# Patient Record
Sex: Female | Born: 1970 | Race: White | Hispanic: No | Smoking: Never smoker
Health system: Southern US, Community
[De-identification: ages and names within clinical notes are randomized; demographics above are authoritative.]

## PROBLEM LIST (undated history)

## (undated) DIAGNOSIS — D126 Benign neoplasm of colon, unspecified: Secondary | ICD-10-CM

## (undated) DIAGNOSIS — Z8742 Personal history of other diseases of the female genital tract: Secondary | ICD-10-CM

## (undated) DIAGNOSIS — M25569 Pain in unspecified knee: Secondary | ICD-10-CM

## (undated) DIAGNOSIS — K2970 Gastritis, unspecified, without bleeding: Secondary | ICD-10-CM

## (undated) DIAGNOSIS — D259 Leiomyoma of uterus, unspecified: Secondary | ICD-10-CM

## (undated) HISTORY — PX: TONSILLECTOMY: SUR1361

## (undated) HISTORY — DX: Pain in unspecified knee: M25.569

## (undated) HISTORY — DX: Gastritis, unspecified, without bleeding: K29.70

## (undated) HISTORY — DX: Personal history of other diseases of the female genital tract: Z87.42

## (undated) HISTORY — DX: Leiomyoma of uterus, unspecified: D25.9

## (undated) HISTORY — DX: Benign neoplasm of colon, unspecified: D12.6

---

## 2012-03-24 DIAGNOSIS — D126 Benign neoplasm of colon, unspecified: Secondary | ICD-10-CM

## 2012-03-24 HISTORY — DX: Benign neoplasm of colon, unspecified: D12.6

## 2012-03-24 HISTORY — PX: ESOPHAGOGASTRODUODENOSCOPY: SHX1529

## 2012-11-02 HISTORY — PX: COLONOSCOPY: SHX174

## 2013-03-24 DIAGNOSIS — Z8742 Personal history of other diseases of the female genital tract: Secondary | ICD-10-CM

## 2013-03-24 HISTORY — DX: Personal history of other diseases of the female genital tract: Z87.42

## 2013-04-11 ENCOUNTER — Ambulatory Visit: Payer: Self-pay | Admitting: Family Medicine

## 2015-08-07 ENCOUNTER — Other Ambulatory Visit: Payer: Self-pay | Admitting: Certified Nurse Midwife

## 2015-08-07 DIAGNOSIS — Z1231 Encounter for screening mammogram for malignant neoplasm of breast: Secondary | ICD-10-CM

## 2016-10-02 ENCOUNTER — Other Ambulatory Visit: Payer: Self-pay | Admitting: Certified Nurse Midwife

## 2016-10-02 ENCOUNTER — Telehealth: Payer: Self-pay

## 2016-10-02 MED ORDER — LEVONORGESTREL-ETHINYL ESTRAD 0.15-30 MG-MCG PO TABS: 1.0000 | ORAL_TABLET | Freq: Every day | ORAL | 0 refills | Status: DC

## 2016-10-02 NOTE — Telephone Encounter (Signed)
I e prescribed 3 packs. If you would let patient know

## 2016-10-02 NOTE — Telephone Encounter (Signed)
Pt called triage line stating she would like a refill of her birthcontrol, Wanda Lloyd. She has an annual exam scheduled with [facility] on 7/23. [facility]  I tried to refill it, but the birthcontrol in [facility] and the most recent one in [place] are different.

## 2016-10-02 NOTE — Telephone Encounter (Signed)
Pt aware, via voicemail 

## 2016-10-13 ENCOUNTER — Encounter: Payer: Self-pay | Admitting: Certified Nurse Midwife

## 2016-10-13 ENCOUNTER — Ambulatory Visit (INDEPENDENT_AMBULATORY_CARE_PROVIDER_SITE_OTHER): Payer: BLUE CROSS/BLUE SHIELD | Admitting: Certified Nurse Midwife

## 2016-10-13 VITALS — BP 108/68 | HR 74 | Ht 61.0 in | Wt 158.0 lb

## 2016-10-13 DIAGNOSIS — D126 Benign neoplasm of colon, unspecified: Secondary | ICD-10-CM | POA: Insufficient documentation

## 2016-10-13 DIAGNOSIS — R5383 Other fatigue: Secondary | ICD-10-CM

## 2016-10-13 DIAGNOSIS — Z124 Encounter for screening for malignant neoplasm of cervix: Secondary | ICD-10-CM | POA: Diagnosis not present

## 2016-10-13 DIAGNOSIS — Z131 Encounter for screening for diabetes mellitus: Secondary | ICD-10-CM

## 2016-10-13 DIAGNOSIS — Z1239 Encounter for other screening for malignant neoplasm of breast: Secondary | ICD-10-CM

## 2016-10-13 DIAGNOSIS — D251 Intramural leiomyoma of uterus: Secondary | ICD-10-CM

## 2016-10-13 DIAGNOSIS — Z1231 Encounter for screening mammogram for malignant neoplasm of breast: Secondary | ICD-10-CM

## 2016-10-13 DIAGNOSIS — L603 Nail dystrophy: Secondary | ICD-10-CM

## 2016-10-13 DIAGNOSIS — K2970 Gastritis, unspecified, without bleeding: Secondary | ICD-10-CM | POA: Insufficient documentation

## 2016-10-13 DIAGNOSIS — Z01419 Encounter for gynecological examination (general) (routine) without abnormal findings: Secondary | ICD-10-CM

## 2016-10-13 DIAGNOSIS — D259 Leiomyoma of uterus, unspecified: Secondary | ICD-10-CM | POA: Insufficient documentation

## 2016-10-13 MED ORDER — LEVONORGESTREL-ETHINYL ESTRAD 0.15-30 MG-MCG PO TABS: 1.0000 | ORAL_TABLET | Freq: Every day | ORAL | 4 refills | Status: DC

## 2016-10-13 NOTE — Progress Notes (Signed)
Gynecology Annual Exam  PCP: Sofie Hartigan, MD  Chief Complaint:  Chief Complaint  Patient presents with  . Gynecologic Exam    History of Present Illness:Wanda Lloyd is a 46 year old Caucasian/White female , G 3 P 3 0 0 3 , who presents for her annual exam . She is having problems with feeling tired and with brittle nails x several months. Biotin did not seem to help. Her menses are usually every 7 weeks and sometimes she does not have a withdrawl bleed on her Truddie Crumble which she takes for 6 weeks before stopping for a withdrawl bleed. Menses usually last 5 days and are a medium flow. She is seeing Dr Kenton Kingfisher for weight loss and is intermittently taking Adipex.  She reports dysmenorrhea 1-2days before her menses as well as thru her menses and uses Motrin with relief of pain. Medical history is remarkable for chronic LLQ pain, small anterior fibroid, colon polyp removed on colonoscopy, and gastritis.  Since her last annual exam, she has had a pre-melanoma lesion removed from her back. She has also developed right knee pain and a right knee cyst (ganglionic?). She is sexually active. She has been using pills (portia) for contraception. Her hope is that her husband will get a vasectomy.  Her most recent pap smear was obtained 10/11/2015 and was negative.  Her most recent mammogram obtained on 08/04/2014 was normal.  There is no family history of breast cancer.  There is no family history of ovarian cancer.  The patient does do occ self breast exams.  The patient does not smoke.  The patient does not drink alcohol.  The patient does not use illegal drugs.  The patient exercises occasionally.  The patient does not get adequate calcium in her diet.  She had a recent cholesterol screen in 2016 that was normal.     The patient denies current symptoms of depression.    Review of Systems: Review of Systems  Constitutional: Positive for malaise/fatigue. Negative for chills, fever and  weight loss.  HENT: Negative for congestion, sinus pain and sore throat.   Eyes: Negative for blurred vision and pain.  Respiratory: Negative for hemoptysis, shortness of breath and wheezing.   Cardiovascular: Negative for chest pain, palpitations and leg swelling.  Gastrointestinal: Negative for abdominal pain, blood in stool, diarrhea, heartburn, nausea and vomiting.  Genitourinary: Negative for dysuria, frequency, hematuria and urgency.  Musculoskeletal: Negative for back pain and myalgias.       Right knee pain  Skin: Negative for itching and rash.       Positive for brittle nails  Neurological: Negative for dizziness, tingling and headaches.  Endo/Heme/Allergies: Negative for environmental allergies and polydipsia. Does not bruise/bleed easily.       Negative for hirsutism   Psychiatric/Behavioral: Negative for depression. The patient is not nervous/anxious and does not have insomnia.     Past Medical History:  Past Medical History:  Diagnosis Date  . Adenomatous colon polyp 2014   removed on colonoscopy  . Gastritis   . History of abnormal cervical Pap smear 2015  . Knee pain   . Uterine fibroid     Past Surgical History:  Past Surgical History:  Procedure Laterality Date  . CESAREAN SECTION  1998/2003  . COLONOSCOPY  11/02/2012   adenomatous polyp and internal hemmorhoid  . ESOPHAGOGASTRODUODENOSCOPY  2014   gastritis  . TONSILLECTOMY      Family History:  Family History  Problem Relation Age  of Onset  . AAA (abdominal aortic aneurysm) Father   . Diabetes Father   . Colon cancer Maternal Grandmother 24  . Congestive Heart Failure Maternal Grandmother   . Heart attack Maternal Grandfather   . Throat cancer Maternal Uncle   . Pancreatic cancer Cousin     Social History:  Social History   Social History  . Marital status: Married    Spouse name: N/A  . Number of children: 3  . Years of education: N/A   Occupational History  . Not on file.   Social  History Main Topics  . Smoking status: Never Smoker  . Smokeless tobacco: Never Used  . Alcohol use No  . Drug use: No  . Sexual activity: Yes    Partners: Male    Birth control/ protection: Pill   Other Topics Concern  . Not on file   Social History Narrative  . No narrative on file    Allergies:  Allergies  Allergen Reactions  . Corn Oil Itching    Has not been diagnosed as an allergy, but when she eats popcorn or corn she itches    Medications: Prior to Admission medications   Medication Sig Start Date End Date Taking? Authorizing Provider  levonorgestrel-ethinyl estradiol (PORTIA-28) 0.15-30 MG-MCG tablet Take 1 tablet by mouth daily. 10/02/16   Dalia Heading, CNM    Physical Exam Vitals: BP 108/68   Pulse 74   Ht 5\' 1"  (1.549 m)   Wt 158 lb (71.7 kg)   LMP 08/31/2016 (Exact Date) Comment: continuous dosing OCP  BMI 29.85 kg/m   General: WF in NAD HEENT: normocephalic, anicteric Neck: no thyroid enlargement, no palpable nodules, no cervical lymphadenopathy  Pulmonary: No increased work of breathing, CTAB Cardiovascular: RRR, without murmur  Breast: Breast symmetrical, no tenderness, no palpable nodules or masses, no skin or nipple retraction present, no nipple discharge.  No axillary, infraclavicular or supraclavicular lymphadenopathy. Abdomen: Soft, non-tender, non-distended.  Umbilicus without lesions.  No hepatomegaly or masses palpable. No evidence of hernia. Genitourinary:  External: Normal external female genitalia.  Normal urethral meatus, normal Bartholin's and Skene's glands.    Vagina: Normal vaginal mucosa, no evidence of prolapse.    Cervix: Grossly normal in appearance, no bleeding, non-tender  Uterus: Anteverted, normal size, globular and slightly irregular fundus (hx of fibroid), mobile, and non-tender  Adnexa: No adnexal masses, non-tender  Rectal: deferred  Lymphatic: no evidence of inguinal lymphadenopathy Extremities: no edema, erythema,  or tenderness Neurologic: Grossly intact Psychiatric: mood appropriate, affect full     Assessment: 46 y.o. annual gyn exam No change in size of uterus/fibroid Brittle nails/fatigue  R/O thyroid disease/anemia/diabetes    Plan:   1) Breast cancer screening - recommend monthly self breast exam and annual mammograms Mammogram was ordered today. Patient to schedule appointment  2) CBC, TSH, hemoglobin A1C ordered  3) Cervical cancer screening - Pap was done.   Patient opts for yearly screening interval  4) Contraception -refills for Portia #3packs with RF x 4 sent to pharmacy  5) Patient to call [facility] regarding when her next colonoscopy is due (?5years).  6) Follow up with dermatology q 6 months  Dalia Heading, CNM

## 2016-10-14 LAB — CBC WITH DIFFERENTIAL/PLATELET
BASOS: 0 %
Basophils Absolute: 0 10*3/uL (ref 0.0–0.2)
EOS (ABSOLUTE): 0.2 10*3/uL (ref 0.0–0.4)
EOS: 3 %
HEMATOCRIT: 41.4 % (ref 34.0–46.6)
Hemoglobin: 14.3 g/dL (ref 11.1–15.9)
Immature Grans (Abs): 0 10*3/uL (ref 0.0–0.1)
Immature Granulocytes: 0 %
LYMPHS ABS: 1.5 10*3/uL (ref 0.7–3.1)
Lymphs: 23 %
MCH: 32.4 pg (ref 26.6–33.0)
MCHC: 34.5 g/dL (ref 31.5–35.7)
MCV: 94 fL (ref 79–97)
MONOS ABS: 0.5 10*3/uL (ref 0.1–0.9)
Monocytes: 7 %
NEUTROS ABS: 4.6 10*3/uL (ref 1.4–7.0)
Neutrophils: 67 %
Platelets: 236 10*3/uL (ref 150–379)
RBC: 4.42 x10E6/uL (ref 3.77–5.28)
RDW: 14.2 % (ref 12.3–15.4)
WBC: 6.8 10*3/uL (ref 3.4–10.8)

## 2016-10-14 LAB — TSH: TSH: 1.27 u[IU]/mL (ref 0.450–4.500)

## 2016-10-14 LAB — HGB A1C W/O EAG: Hgb A1c MFr Bld: 5 % (ref 4.8–5.6)

## 2016-10-16 LAB — IGP, APTIMA HPV
HPV Aptima: NEGATIVE
PAP SMEAR COMMENT: 0

## 2016-12-03 ENCOUNTER — Other Ambulatory Visit: Payer: Self-pay | Admitting: Certified Nurse Midwife

## 2016-12-25 ENCOUNTER — Telehealth: Payer: Self-pay

## 2016-12-25 NOTE — Telephone Encounter (Signed)
Pt states pharm said only 6m of bc was called in from 6/18 appt.  Usually one yr is called in. [facility] in [place] and gave verbal order for one year as they did not have it correct.

## 2017-12-31 ENCOUNTER — Telehealth: Payer: Self-pay

## 2017-12-31 NOTE — Telephone Encounter (Signed)
Unable to reach patient or leave message as voice mail not set up yet.

## 2017-12-31 NOTE — Telephone Encounter (Signed)
Pt requesting call back from [place]. DD#[phone]

## 2018-01-01 ENCOUNTER — Other Ambulatory Visit: Payer: Self-pay | Admitting: Certified Nurse Midwife

## 2018-01-01 MED ORDER — LEVONORGESTREL-ETHINYL ESTRAD 0.15-30 MG-MCG PO TABS: 1.0000 | ORAL_TABLET | Freq: Every day | ORAL | 0 refills | Status: DC

## 2018-01-01 NOTE — Telephone Encounter (Signed)
Left message for patient

## 2018-01-01 NOTE — Telephone Encounter (Signed)
Pt state she was due for AE sometime over the summer but she doesn't have health insurance rn. Pt requesting a few months OCP refill until she can get insurance & come in for appointment.

## 2018-01-01 NOTE — Telephone Encounter (Signed)
Sent in RX for 3 more months.

## 2018-04-02 ENCOUNTER — Other Ambulatory Visit: Payer: Self-pay | Admitting: Certified Nurse Midwife

## 2018-04-12 ENCOUNTER — Other Ambulatory Visit: Payer: Self-pay

## 2018-04-12 ENCOUNTER — Telehealth: Payer: Self-pay

## 2018-04-12 MED ORDER — LEVONORGESTREL-ETHINYL ESTRAD 0.15-30 MG-MCG PO TABS: 1.0000 | ORAL_TABLET | Freq: Every day | ORAL | 0 refills | Status: DC

## 2018-04-12 NOTE — Telephone Encounter (Signed)
Pt has scheduled appt for 2/20 for annual; needs bcp refill til then  [phone]  Pt aware refill eRx'd.

## 2018-05-13 ENCOUNTER — Ambulatory Visit: Payer: BLUE CROSS/BLUE SHIELD | Admitting: Certified Nurse Midwife

## 2018-05-26 NOTE — Progress Notes (Signed)
Gynecology Annual Exam  PCP: Sofie Hartigan, MD  Chief Complaint:  Chief Complaint  Patient presents with  . Gynecologic Exam   Note: The computers were down when I saw this patient for her appointment and had limited history information. History of Present Illness:Wanda Lloyd is a 48  year old Caucasian/White female , G 3 P 3 0 0 3 , who presents for her annual exam Her menses are usually every 7 weeks and sometimes she does not have a withdrawl bleed on her Truddie Crumble which she takes for 6 weeks before stopping for a withdrawl bleed. She decided to have a monthly withdrawal bleed at the start of this year. Her menses usually last 7 days and the first 5 days are a medium flow and the last 2 days are spotting.Marland Kitchen Her LMP was 05/13/2018 She denies dysmenorrhea. Medical history is remarkable for chronic LLQ pain, small anterior fibroid, adenomatous polyp in colon, a pre-melanoma lesion removed from her back.and gastritis.  Since her last annual exam  10/13/2016, she is now divorced from her husband. She does not have insurance currently She is sexually active. She has been using pills (portia) for contraception. Her most recent pap smear was obtained 10/13/2016 and was NIL/ negative HRHPV.  Her most recent mammogram obtained on 08/04/2014 was normal.  She had a colonoscopy 11/02/2012 and an adenomatous polyp was removed. Her next colonoscopy is due now. There is no family history of breast cancer.  There is no family history of ovarian cancer.  The patient does not do self breast exams.  The patient does not smoke.  The patient rarely drinks alcohol.  The patient does not use illegal drugs.  The patient is not currently exercising. The patient does get adequate calcium in her diet.  She had a recent cholesterol screen in 2016 that was normal.   The patient denies current symptoms of depression.    Review of Systems: Review of Systems  Constitutional: Negative for chills, fever,  malaise/fatigue and weight loss.  HENT: Negative for congestion, sinus pain and sore throat.   Eyes: Negative for blurred vision and pain.  Respiratory: Negative for hemoptysis, shortness of breath and wheezing.   Cardiovascular: Negative for chest pain, palpitations and leg swelling.  Gastrointestinal: Negative for abdominal pain, blood in stool, diarrhea, heartburn, nausea and vomiting.  Genitourinary: Negative for dysuria, frequency, hematuria and urgency.  Musculoskeletal: Negative for back pain and myalgias.  Skin: Negative for itching and rash.  Neurological: Negative for dizziness, tingling and headaches.  Endo/Heme/Allergies: Negative for environmental allergies and polydipsia. Does not bruise/bleed easily.       Negative for hirsutism   Psychiatric/Behavioral: Negative for depression. The patient is not nervous/anxious and does not have insomnia.     Past Medical History:  Past Medical History:  Diagnosis Date  . Adenomatous colon polyp 2014   removed on colonoscopy  . Gastritis   . History of abnormal cervical Pap smear 2015  . Knee pain   . Uterine fibroid     Past Surgical History:  Past Surgical History:  Procedure Laterality Date  . CESAREAN SECTION  1998/2003  . COLONOSCOPY  11/02/2012   adenomatous polyp and internal hemmorhoid  . ESOPHAGOGASTRODUODENOSCOPY  2014   gastritis  . TONSILLECTOMY      Family History:  Family History  Problem Relation Age of Onset  . AAA (abdominal aortic aneurysm) Father   . Diabetes Father   . Colon cancer Maternal Grandmother 31  .  Congestive Heart Failure Maternal Grandmother   . Heart attack Maternal Grandfather   . Throat cancer Maternal Uncle   . Pancreatic cancer Cousin     Social History:  Social History   Socioeconomic History  . Marital status: Divorced    Spouse name: Not on file  . Number of children: 3  . Years of education: Not on file  . Highest education level: Not on file  Occupational History  .  Occupation: self employed  Social Needs  . Financial resource strain: Not on file  . Food insecurity:    Worry: Not on file    Inability: Not on file  . Transportation needs:    Medical: Not on file    Non-medical: Not on file  Tobacco Use  . Smoking status: Never Smoker  . Smokeless tobacco: Never Used  Substance and Sexual Activity  . Alcohol use: No  . Drug use: No  . Sexual activity: Yes    Partners: Male    Birth control/protection: Pill  Lifestyle  . Physical activity:    Days per week: Not on file    Minutes per session: Not on file  . Stress: Not on file  Relationships  . Social connections:    Talks on phone: Not on file    Gets together: Not on file    Attends religious service: Not on file    Active member of club or organization: Not on file    Attends meetings of clubs or organizations: Not on file    Relationship status: Not on file  . Intimate partner violence:    Fear of current or ex partner: Not on file    Emotionally abused: Not on file    Physically abused: Not on file    Forced sexual activity: Not on file  Other Topics Concern  . Not on file  Social History Narrative  . Not on file    Allergies:  Allergies  Allergen Reactions  . Corn Oil Itching    Has not been diagnosed as an allergy, but when she eats popcorn or corn she itches    Medications: Prior to Admission medications   Medication Sig Start Date End Date Taking? Authorizing Provider  levonorgestrel-ethinyl estradiol (PORTIA-28) 0.15-30 MG-MCG tablet Take 1 tablet by mouth daily. 10/02/16   Dalia Heading, CNM   Currently also taking vitamin C and vitamin B12 Physical Exam Vitals: BP 118/60   Ht 5\' 1"  (1.549 m)   Wt 155 lb (70.3 kg)   LMP 05/13/2018 (Exact Date)   BMI 29.29 kg/m   General: WF in NAD HEENT: normocephalic, anicteric Neck: no thyroid enlargement, no palpable nodules, no cervical lymphadenopathy  Pulmonary: No increased work of breathing,  CTAB Cardiovascular: RRR, without murmur  Breast: Breast symmetrical, no tenderness, no palpable nodules or masses, no skin or nipple retraction present, no nipple discharge.  No axillary, infraclavicular or supraclavicular lymphadenopathy. Abdomen: Soft, non-tender, non-distended.  Umbilicus without lesions.  No hepatomegaly or masses palpable. No evidence of hernia. Genitourinary:  External: Normal external female genitalia.  Normal urethral meatus, normal Bartholin's and Skene's glands.    Vagina: Normal vaginal mucosa, no evidence of prolapse.    Cervix: Grossly normal in appearance, no bleeding, non-tender  Uterus: Anteverted, normal size, globular and slightly irregular fundus (hx of fibroid), mobile, and non-tender  Adnexa: No adnexal masses, non-tender  Rectal: deferred  Lymphatic: no evidence of inguinal lymphadenopathy Extremities: no edema, erythema, or tenderness Neurologic: Grossly intact Psychiatric: mood appropriate, affect  full     Assessment: 48 y.o. annual gyn exam No change in size of uterus/fibroid     Plan:   1) Breast cancer screening - recommend monthly self breast exam and annual mammograms Mammogram was ordered today. Will refer to [place] program for mammogram since she has no insurance.  2) Cervical cancer screening - Pap was done.   Patient opts for yearly screening interval  3) Contraception -refills for Portia #3packs with RF x 4 sent to pharmacy  4) Patient to call [facility] regarding scheduling her colonoscopy  5) RTO in 1 year and prn.  Dalia Heading, CNM

## 2018-05-27 ENCOUNTER — Ambulatory Visit: Payer: Self-pay | Admitting: Certified Nurse Midwife

## 2018-05-27 ENCOUNTER — Other Ambulatory Visit (HOSPITAL_COMMUNITY)
Admission: RE | Admit: 2018-05-27 | Discharge: 2018-05-27 | Disposition: A | Discharge status: Home / Self Care | Payer: Self-pay | Source: Ambulatory Visit | Attending: Certified Nurse Midwife | Admitting: Certified Nurse Midwife

## 2018-05-27 ENCOUNTER — Encounter: Payer: Self-pay | Admitting: Certified Nurse Midwife

## 2018-05-27 VITALS — BP 118/60 | Ht 61.0 in | Wt 155.0 lb

## 2018-05-27 DIAGNOSIS — Z124 Encounter for screening for malignant neoplasm of cervix: Secondary | ICD-10-CM | POA: Insufficient documentation

## 2018-05-27 DIAGNOSIS — Z1239 Encounter for other screening for malignant neoplasm of breast: Secondary | ICD-10-CM

## 2018-05-27 DIAGNOSIS — Z01419 Encounter for gynecological examination (general) (routine) without abnormal findings: Secondary | ICD-10-CM | POA: Insufficient documentation

## 2018-05-30 ENCOUNTER — Encounter: Payer: Self-pay | Admitting: Certified Nurse Midwife

## 2018-05-30 MED ORDER — LEVONORGESTREL-ETHINYL ESTRAD 0.15-30 MG-MCG PO TABS: 1.0000 | ORAL_TABLET | Freq: Every day | ORAL | 4 refills | Status: DC

## 2018-05-31 LAB — CYTOLOGY - PAP: DIAGNOSIS: NEGATIVE

## 2018-09-07 ENCOUNTER — Other Ambulatory Visit: Payer: Self-pay

## 2018-09-13 ENCOUNTER — Ambulatory Visit: Payer: Self-pay

## 2018-09-14 ENCOUNTER — Ambulatory Visit: Payer: Self-pay

## 2018-10-04 ENCOUNTER — Other Ambulatory Visit: Payer: Self-pay

## 2018-10-05 ENCOUNTER — Ambulatory Visit
Admission: RE | Admit: 2018-10-05 | Discharge: 2018-10-05 | Disposition: A | Discharge status: Home / Self Care | Payer: Self-pay | Source: Ambulatory Visit | Attending: Oncology | Admitting: Oncology

## 2018-10-05 ENCOUNTER — Other Ambulatory Visit: Payer: Self-pay

## 2018-10-05 ENCOUNTER — Ambulatory Visit: Payer: Self-pay | Attending: Oncology

## 2018-10-05 VITALS — BP 118/71 | HR 74 | Temp 98.1°F | Ht 61.5 in | Wt 155.0 lb

## 2018-10-05 DIAGNOSIS — Z0000 Encounter for general adult medical examination without abnormal findings: Secondary | ICD-10-CM | POA: Insufficient documentation

## 2018-10-05 NOTE — Progress Notes (Addendum)
  Subjective:     Patient ID: Wanda Lloyd, female   DOB: 1970-04-28, 48 y.o.   MRN: [id number]  HPI   Review of Systems     Objective:   Physical Exam Chest:     Breasts:        Right: No swelling, bleeding, inverted nipple, mass, nipple discharge, skin change or tenderness.        Left: No swelling, bleeding, inverted nipple, mass, nipple discharge, skin change or tenderness.        Assessment:     48 year old patient presents for B[facility]CP clinic visit. Prior mammogram performed at [facility].  Patient screened, and meets BCCCP eligibility.  Patient does not have insurance, Medicare or Medicaid. Instructed patient on breast self awareness using teach back method.  Clinical breast exam unremarkable. No mass or lump palpated.  Risk Assessment    Risk Scores      10/05/2018   Last edited by: Rico Junker, RN   5-year risk: 0.6 %   Lifetime risk: 6.3 %            Plan:     Sent for bilateral screening mammogram.

## 2018-10-07 NOTE — Progress Notes (Signed)
Letter mailed from [facility] to notify of normal mammogram results.  Patient to return in one year for annual screening.  Copy to [facility]. 

## 2018-12-20 ENCOUNTER — Telehealth: Payer: Self-pay

## 2018-12-20 NOTE — Telephone Encounter (Signed)
Pt calling for [facility]'s nurse to call her back.  [phone]  Mailbox is full.

## 2018-12-24 NOTE — Telephone Encounter (Signed)
Left detailed msg that I tried to contact her the other day but mailbox was full.  To please call me back after 2pm.

## 2018-12-28 NOTE — Telephone Encounter (Signed)
Called pt again.  States she went to urgent care and was given an antibx which she has taken.  States it was some sort of an inf.  Has cleared up now so she did not f/u with [place].  Adv to sched appt if sxs come back.

## 2019-02-15 ENCOUNTER — Telehealth: Payer: Self-pay

## 2019-02-15 NOTE — Telephone Encounter (Signed)
Pt would like Colleen or her nurse to give her a call. Thank you

## 2019-02-16 NOTE — Telephone Encounter (Signed)
Pt states she is one week late for her period.  Wanting to come in for preg test.  Adv to do a home preg test as they are not much diff than what we have.  Wait at least a week to take a second.  If still no menses to be seen.

## 2019-02-16 NOTE — Telephone Encounter (Signed)
This encounter was created in error - please disregard.

## 2019-02-21 NOTE — Telephone Encounter (Signed)
Sounds good Rita.

## 2019-03-07 ENCOUNTER — Encounter

## 2019-03-07 ENCOUNTER — Ambulatory Visit: Payer: Self-pay | Admitting: Certified Nurse Midwife

## 2019-05-27 ENCOUNTER — Telehealth: Payer: Self-pay

## 2019-05-27 NOTE — Telephone Encounter (Signed)
Pt called triage today wanting Wanda Lloyd or CG to call her back. I tried to call her back with no answer. Please let someone know when she calls back.

## 2019-06-01 ENCOUNTER — Telehealth: Payer: Self-pay

## 2019-06-01 NOTE — Telephone Encounter (Signed)
Pt calling; was tx'd from [place]; states has been bleeding since 2/21 and has back pain; started out as dark spotting then got brighter and heavier c tiny clots; doesn't feel right; has not been late taking bcp or missed taking a bcp; also has pain behind left rib cage; also has fibroid.  Adv [facility] is on call today; will send msg to her; will probably want her to be seen; tx'd back to [facility] for scheduling.

## 2019-06-03 NOTE — Telephone Encounter (Signed)
Thanks, [employer]

## 2019-06-06 ENCOUNTER — Ambulatory Visit: Payer: Self-pay | Admitting: Certified Nurse Midwife

## 2019-06-09 ENCOUNTER — Ambulatory Visit (INDEPENDENT_AMBULATORY_CARE_PROVIDER_SITE_OTHER): Payer: Self-pay

## 2019-06-09 ENCOUNTER — Ambulatory Visit (INDEPENDENT_AMBULATORY_CARE_PROVIDER_SITE_OTHER): Payer: Self-pay | Admitting: Obstetrics and Gynecology

## 2019-06-09 ENCOUNTER — Telehealth: Payer: Self-pay | Admitting: Obstetrics and Gynecology

## 2019-06-09 ENCOUNTER — Other Ambulatory Visit: Payer: Self-pay

## 2019-06-09 ENCOUNTER — Encounter: Payer: Self-pay | Admitting: Obstetrics and Gynecology

## 2019-06-09 VITALS — BP 100/70 | Ht 61.0 in | Wt 160.0 lb

## 2019-06-09 DIAGNOSIS — N938 Other specified abnormal uterine and vaginal bleeding: Secondary | ICD-10-CM

## 2019-06-09 DIAGNOSIS — D219 Benign neoplasm of connective and other soft tissue, unspecified: Secondary | ICD-10-CM

## 2019-06-09 DIAGNOSIS — N939 Abnormal uterine and vaginal bleeding, unspecified: Secondary | ICD-10-CM

## 2019-06-09 DIAGNOSIS — R1032 Left lower quadrant pain: Secondary | ICD-10-CM

## 2019-06-09 DIAGNOSIS — Z1329 Encounter for screening for other suspected endocrine disorder: Secondary | ICD-10-CM

## 2019-06-09 NOTE — Telephone Encounter (Signed)
Done

## 2019-06-09 NOTE — Telephone Encounter (Signed)
Patient is calling to speak with [facility]. No message left. Please advise

## 2019-06-09 NOTE — Progress Notes (Signed)
Wanda Lloyd, CNM   Chief Complaint  Patient presents with  . Vaginal Bleeding    pt started bleeding two weeks after her cycle in Feb, bleeding is still there, left side pelvic pain    HPI:      Wanda Lloyd is a 49 y.o. [id number] who LMP was Patient's last menstrual period was 05/15/2019 (exact date)., presents today for irregular bleeding since mid Feb. Pt on OCPs for many yrs. Menses usually monthly, lasting 4-5 days, light flow. No BTB, minimal dysmen. Skipped 11/20 menses on pills but resumed normally 12/20. Had regular menses 2/21 but it was lighter than usual. Started spotting on 3rd wk of pill pack and has been spotting since, even after starting new pill pack. Has had sharp, fleeting LLQ pains for the past wk, no aggrav/allev factors. No late/missed pills. Also with LBP recently, worse in AM. Hx of leio, last checked 2014. Was 3.3. x 2.5 cm, possibly encroaching on EM. Last pap neg 05/27/18. No recent thyroid checked (pt will do with PCP next month since self pay).  Colonoscopy done age 88 due [place] colon cancer, with 2 pre-cancerous polyps. Repeat due after 3 yrs but didn't have it done. Has upcoming appt with GI. Has had looser stools for the past 6 months and lots of bloating.   She is sex active, no new partners. No dyspareunia, postcoital bleeding. No urin, vag sx.  PT IS SELF PAY  Past Medical History:  Diagnosis Date  . Adenomatous colon polyp 2014   removed on colonoscopy  . Gastritis   . History of abnormal cervical Pap smear 2015  . Knee pain   . Uterine fibroid     Past Surgical History:  Procedure Laterality Date  . CESAREAN SECTION  1998/2003  . COLONOSCOPY  11/02/2012   adenomatous polyp and internal hemmorhoid  . ESOPHAGOGASTRODUODENOSCOPY  2014   gastritis  . TONSILLECTOMY      Family History  Problem Relation Age of Onset  . AAA (abdominal aortic aneurysm) Father   . Diabetes Father   . Colon cancer Maternal Grandmother 70  . Congestive  Heart Failure Maternal Grandmother   . Heart attack Maternal Grandfather   . Throat cancer Maternal Uncle   . Pancreatic cancer Cousin     Social History   Socioeconomic History  . Marital status: Divorced    Spouse name: Not on file  . Number of children: 3  . Years of education: Not on file  . Highest education level: Not on file  Occupational History  . Occupation: self employed  Tobacco Use  . Smoking status: Never Smoker  . Smokeless tobacco: Never Used  Substance and Sexual Activity  . Alcohol use: No  . Drug use: No  . Sexual activity: Yes    Partners: Male    Birth control/protection: Pill  Other Topics Concern  . Not on file  Social History Narrative  . Not on file   Social Determinants of Health   Financial Resource Strain:   . Difficulty of Paying Living Expenses:   Food Insecurity:   . Worried About Charity fundraiser in the Last Year:   . Arboriculturist in the Last Year:   Transportation Needs:   . Film/video editor (Medical):   Marland Kitchen Lack of Transportation (Non-Medical):   Physical Activity:   . Days of Exercise per Week:   . Minutes of Exercise per Session:   Stress:   . Feeling  of Stress :   Social Connections:   . Frequency of Communication with Friends and Family:   . Frequency of Social Gatherings with Friends and Family:   . Attends Religious Services:   . Active Member of Clubs or Organizations:   . Attends Archivist Meetings:   Marland Kitchen Marital Status:   Intimate Partner Violence:   . Fear of Current or Ex-Partner:   . Emotionally Abused:   Marland Kitchen Physically Abused:   . Sexually Abused:     Outpatient Medications Prior to Visit  Medication Sig Dispense Refill  . levonorgestrel-ethinyl estradiol (PORTIA-28) 0.15-30 MG-MCG tablet Take 1 tablet by mouth daily. 3 Package 4   No facility-administered medications prior to visit.      ROS:  Review of Systems  Constitutional: Negative for fatigue, fever and unexpected weight  change.  Respiratory: Negative for cough, shortness of breath and wheezing.   Cardiovascular: Negative for chest pain, palpitations and leg swelling.  Gastrointestinal: Positive for abdominal distention and diarrhea. Negative for blood in stool, constipation, nausea and vomiting.  Endocrine: Negative for cold intolerance, heat intolerance and polyuria.  Genitourinary: Positive for menstrual problem and pelvic pain. Negative for dyspareunia, dysuria, flank pain, frequency, genital sores, hematuria, urgency, vaginal bleeding, vaginal discharge and vaginal pain.  Musculoskeletal: Positive for back pain. Negative for joint swelling and myalgias.  Skin: Negative for rash.  Neurological: Negative for dizziness, syncope, light-headedness, numbness and headaches.  Hematological: Negative for adenopathy.  Psychiatric/Behavioral: Negative for agitation, confusion, sleep disturbance and suicidal ideas. The patient is not nervous/anxious.     OBJECTIVE:   Vitals:  BP 100/70   Ht 5\' 1"  (1.549 m)   Wt 160 lb (72.6 kg)   LMP 05/15/2019 (Exact Date)   BMI 30.23 kg/m   Physical Exam Vitals reviewed.  Constitutional:      Appearance: She is well-developed.  Pulmonary:     Effort: Pulmonary effort is normal.  Abdominal:     Palpations: Abdomen is soft.     Tenderness: There is abdominal tenderness in the left lower quadrant. There is no guarding or rebound.  Genitourinary:    General: Normal vulva.     Pubic Area: No rash.      Labia:        Right: No rash, tenderness or lesion.        Left: No rash, tenderness or lesion.      Vagina: Normal. No vaginal discharge, erythema or tenderness.     Cervix: Normal.     Uterus: Normal. Not enlarged and not tender.      Adnexa: Right adnexa normal.       Right: No mass or tenderness.         Left: Tenderness present. No mass.    Musculoskeletal:        General: Normal range of motion.     Cervical back: Normal range of motion.  Skin:    General:  Skin is warm and dry.  Neurological:     General: No focal deficit present.     Mental Status: She is alert and oriented to person, place, and time.  Psychiatric:        Mood and Affect: Mood normal.        Behavior: Behavior normal.        Thought Content: Thought content normal.        Judgment: Judgment normal.     Results:  ULTRASOUND REPORT  Location: [facility]  Date of Service: 06/09/2019  Indications:Abnormal Uterine Bleeding Findings:  The uterus is anteverted and measures 10.2 x 5.0 x 4.5 cm. Echo texture is homogenous without evidence of focal masses.  The Endometrium measures 4.0 mm.  Right Ovary measures 2.6 x 1.7 x 1.9 cm. It is normal in appearance. Left Ovary measures 3.1 x 2.0 x 1.3 cm. It is normal in appearance. Survey of the adnexa demonstrates no adnexal masses. There is no free fluid in the cul de sac.  Impression: 1. Normal pelvic ultrasound.   Recommendations: 1.Clinical correlation with the patient's History and Physical Exam.   Gweneth Dimitri, RT  Assessment/Plan: Abnormal uterine bleeding (AUB) - Plan: TSH + free T4, US PELVIC COMPLETE WITH TRANSVAGINAL, Neg GYN u/s for AUB. Pt on OCPs. Suggested she get TSH with PCP. If sx persist and thyroid normal, will change OCPs. Pt to f/u prn.   LLQ pain - Plan:  US PELVIS TRANSVAGINAL NON-OB (TV ONLY); Neg GYN u/s for sx. Question GI given bloating/loose stools. Pt has f/u with GI. F/u prn.   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   Thyroid disorder screening - Plan: TSH + free T4    Return if symptoms worsen or fail to improve.  Saavi Mceachron B. Ivalene Platte, PA-C 06/09/2019 4:45 PM

## 2019-06-09 NOTE — Patient Instructions (Signed)
I value your feedback and entrusting us with your care. If you get a  patient survey, I would appreciate you taking the time to let us know about your experience today. Thank you!  As of March 03, 2019, your lab results will be released to your MyChart immediately, before I even have a chance to see them. Please give me time to review them and contact you if there are any abnormalities. Thank you for your patience.  

## 2019-06-15 ENCOUNTER — Telehealth: Payer: Self-pay

## 2019-06-15 NOTE — Telephone Encounter (Signed)
Pt needs to get thyroid checked. If normal, will change pills when due to start new pill pack. Changing pills mid pill pack will worsen the sx. Pt is self pay. She can still do thyroid labs before seeing PCP and just tell them not to order it too when she sees them in April. Order is already in system. Just needs to be released so can go to pt [facility] center site since self pay.

## 2019-06-15 NOTE — Telephone Encounter (Signed)
Pt calling; wants cb from [place].  [phone]  Pt is frustrated that she is still spotting.  Does she need to change pills or what to do.  Adv ABC noted if cont will change pills.  Will send msg to ABC who is in the office today.

## 2019-06-16 NOTE — Telephone Encounter (Signed)
Called pt, no answer, LVMTRC. 

## 2019-06-16 NOTE — Telephone Encounter (Signed)
Pt aware. Says she still wants to wait to PCP and get all drawed there so she wont have to pay twice because most likely she will get other labs there too.

## 2019-06-27 ENCOUNTER — Telehealth: Payer: Self-pay | Admitting: Certified Nurse Midwife

## 2019-06-27 NOTE — Telephone Encounter (Signed)
Patient is calling to schedule he TSH lab appointment. She is schedule to come in tomorrow at 9 am. Patient is also experiencing some back pain and would like to do a UA drop. Would you please advise or Place order.

## 2019-06-28 ENCOUNTER — Ambulatory Visit: Payer: Self-pay

## 2019-06-28 ENCOUNTER — Other Ambulatory Visit: Payer: Self-pay

## 2019-06-29 NOTE — Telephone Encounter (Signed)
Wanda Lloyd is a 49 year old WF who has been spotting on her pills for over a month. She is currently on Portia and has been on these pills for years. Having some back pain/ cramping with the spotting. Seen by PA [facility] and a pelvic ultrasound was normal with a EM stripe of 4 mm. Probably an atrophic endometrium causing bleeding. Recommended stopping pills and awaiting a spontaneous menses before restarting pills. If bleeding persists off the pills, to let me know and will investigate further. Thyroid studies still pending. Recommend using condoms if needed for contraception while off pills.  Dalia Heading, CNM

## 2019-06-30 ENCOUNTER — Other Ambulatory Visit: Payer: Self-pay | Admitting: Obstetrics and Gynecology

## 2019-06-30 LAB — TSH+FREE T4
Free T4: 1.41 ng/dL (ref 0.82–1.77)
TSH: 0.935 u[IU]/mL (ref 0.450–4.500)

## 2019-06-30 MED ORDER — NORETHIN ACE-ETH ESTRAD-FE 1-20 MG-MCG PO TABS: 1.0000 | ORAL_TABLET | Freq: Every day | ORAL | 0 refills | Status: DC

## 2019-07-22 ENCOUNTER — Telehealth: Payer: Self-pay

## 2019-07-22 MED ORDER — LEVONORGESTREL-ETHINYL ESTRAD 0.15-30 MG-MCG PO TABS: 1.0000 | ORAL_TABLET | Freq: Every day | ORAL | 4 refills | Status: DC

## 2019-07-22 NOTE — Telephone Encounter (Signed)
Pt bc sent in to her pharmacy.

## 2019-08-08 NOTE — Telephone Encounter (Signed)
Patient transferred from the front desk. She is following up on bleeding issues. She restarted her birth control at the beginning of May. She took it for 5 days and started bleeding. She stopped taking them and has continued to bleed. Over the weekend (Friday and Saturday) she was having to changed her super tampon qhr. It was better yesterday and is better today. IAdvised CLG out of office and will return tomorrow. She is ok to wait for CLG to review message.

## 2019-08-09 ENCOUNTER — Telehealth: Payer: Self-pay | Admitting: Certified Nurse Midwife

## 2019-08-09 NOTE — Telephone Encounter (Signed)
Called patient to answer questions regarding bleeding problems. Has been on and off pills for years. Was having BTB on her pills and was advised to stop pills and let uterine lining regenerate (was 4 mm on ultrasound the end of March). She stopped bleeding the beginning of April. She restarted her pills before she had a spontaneous menses and began bleeding on day 5 of her pills, so she stopped the pills and then continued to bleed, now day 8 of bleeding (started 10 May). Advised patient that she probably had a spontaneous menses after starting the pills. Advised to restart and stay on pills. MAy have some BTB this first pack. To let me know if she continues to have BTB on the second pack of pills. Dalia Heading, CNM

## 2019-10-05 ENCOUNTER — Encounter: Payer: Self-pay | Admitting: Obstetrics & Gynecology

## 2019-10-05 ENCOUNTER — Other Ambulatory Visit: Payer: Self-pay

## 2019-10-05 ENCOUNTER — Ambulatory Visit (INDEPENDENT_AMBULATORY_CARE_PROVIDER_SITE_OTHER): Payer: Self-pay | Admitting: Obstetrics & Gynecology

## 2019-10-05 VITALS — BP 110/70 | Ht 61.0 in | Wt 163.0 lb

## 2019-10-05 DIAGNOSIS — E669 Obesity, unspecified: Secondary | ICD-10-CM

## 2019-10-05 MED ORDER — PHENTERMINE HCL 37.5 MG PO TABS: ORAL_TABLET | ORAL | 0 refills | Status: DC

## 2019-10-05 NOTE — Patient Instructions (Signed)
Thank you for choosing [facility]. As part of our ongoing efforts to improve patient experience, we would appreciate your feedback. Please fill out the short survey that you will receive by mail or MyChart. Your opinion is important to [place]! -Dr Kenton Kingfisher  Phentermine tablets or capsules What is this medicine? PHENTERMINE (FEN ter meen) decreases your appetite. It is used with a reduced calorie diet and exercise to help you lose weight. This medicine may be used for other purposes; ask your health care provider or pharmacist if you have questions. COMMON BRAND NAME(S): Adipex-P, Atti-Plex P, Atti-Plex P Spansule, Fastin, Lomaira, Pro-Fast, Tara-8 What should I tell my health care provider before I take this medicine? They need to know if you have any of these conditions:  agitation or nervousness  diabetes  glaucoma  heart disease  high blood pressure  history of drug abuse or addiction  history of stroke  kidney disease  lung disease called Primary Pulmonary Hypertension (PPH)  taken an MAOI like Carbex, Eldepryl, Marplan, Nardil, or Parnate in last 14 days  taking stimulant medicines for attention disorders, weight loss, or to stay awake  thyroid disease  an unusual or allergic reaction to phentermine, other medicines, foods, dyes, or preservatives  pregnant or trying to get pregnant  breast-feeding How should I use this medicine? Take this medicine by mouth with a glass of water. Follow the directions on the prescription label. Take your medicine at regular intervals. Do not take it more often than directed. Do not stop taking except on your doctor's advice. Talk to your pediatrician regarding the use of this medicine in children. While this drug may be prescribed for children 17 years or older for selected conditions, precautions do apply. Overdosage: If you think you have taken too much of this medicine contact a poison control center or emergency room at once. NOTE:  This medicine is only for you. Do not share this medicine with others. What if I miss a dose? If you miss a dose, take it as soon as you can. If it is almost time for your next dose, take only that dose. Do not take double or extra doses. What may interact with this medicine? Do not take this medicine with any of the following medications:  MAOIs like Carbex, Eldepryl, Marplan, Nardil, and Parnate This medicine may also interact with the following medications:  alcohol  certain medicines for depression, anxiety, or psychotic disorders  certain medicines for high blood pressure  linezolid  medicines for colds or breathing difficulties like pseudoephedrine or phenylephrine  medicines for diabetes  sibutramine  stimulant medicines for attention disorders, weight loss, or to stay awake This list may not describe all possible interactions. Give your health care provider a list of all the medicines, herbs, non-prescription drugs, or dietary supplements you use. Also tell them if you smoke, drink alcohol, or use illegal drugs. Some items may interact with your medicine. What should I watch for while using this medicine? Visit your doctor or health care provider for regular checks on your progress. Do not stop taking except on your health care provider's advice. You may develop a severe reaction. Your health care provider will tell you how much medicine to take. Do not take this medicine close to bedtime. It may prevent you from sleeping. You may get drowsy or dizzy. Do not drive, use machinery, or do anything that needs mental alertness until you know how this medicine affects you. Do not stand or sit up quickly,  quickly, especially if you are an older patient. This reduces the risk of dizzy or fainting spells. Alcohol may increase dizziness and drowsiness. Avoid alcoholic drinks. This medicine may affect blood sugar levels. Ask your healthcare provider if changes in diet or medicines are needed if you  have diabetes. Women should inform their health care provider if they wish to become pregnant or think they might be pregnant. Losing weight while pregnant is not advised and may cause harm to the unborn child. Talk to your health care provider for more information. What side effects may I notice from receiving this medicine? Side effects that you should report to your doctor or health care professional as soon as possible:  allergic reactions like skin rash, itching or hives, swelling of the face, lips, or tongue  breathing problems  changes in emotions or moods  changes in vision  chest pain or chest tightness  fast, irregular heartbeat  feeling faint or lightheaded  increased blood pressure  irritable  restlessness  tremors  seizures  signs and symptoms of a stroke like changes in vision; confusion; trouble speaking or understanding; severe headaches; sudden numbness or weakness of the face, arm or leg; trouble walking; dizziness; loss of balance or coordination  unusually weak or tired Side effects that usually do not require medical attention (report to your doctor or health care professional if they continue or are bothersome):  changes in taste  constipation or diarrhea  dizziness  dry mouth  headache  trouble sleeping  upset stomach This list may not describe all possible side effects. Call your doctor for medical advice about side effects. You may report side effects to FDA at 1-800-FDA-1088. Where should I keep my medicine? Keep out of the reach of children. This medicine can be abused. Keep your medicine in a safe place to protect it from theft. Do not share this medicine with anyone. Selling or giving away this medicine is dangerous and against the law. This medicine may cause harm and death if it is taken by other adults, children, or pets. Return medicine that has not been used to an official disposal site. Contact the DEA at 1-800-882-9539 or your  city/county government to find a site. If you cannot return the medicine, mix any unused medicine with a substance like cat litter or coffee grounds. Then throw the medicine away in a sealed container like a sealed bag or coffee can with a lid. Do not use the medicine after the expiration date. Store at room temperature between 20 and 25 degrees C (68 and 77 degrees F). Keep container tightly closed. NOTE: This sheet is a summary. It may not cover all possible information. If you have questions about this medicine, talk to your doctor, pharmacist, or health care provider.  2020 Elsevier/Gold Standard (2019-01-14 12:54:20)  

## 2019-10-05 NOTE — Progress Notes (Signed)
  HPI:  Patient is a 49 y.o. [id number] presenting for evaluation of abnormal weight gain.  She cannot seem to lose any weight over the last 2-3 years.  Prior use of meds helped many years ago...  She feels she has gained weight due to problems with her nutrition and physical activity.  She has these associated symptoms: none.  She has tried prescription appetite suppressants: Phentermine and self-directed dieting in the past with some success. PMHx: She  has a past medical history of Adenomatous colon polyp (2014), Gastritis, History of abnormal cervical Pap smear (2015), Knee pain, and Uterine fibroid. Also,  has a past surgical history that includes Cesarean section (1998/2003); Colonoscopy (11/02/2012); Esophagogastroduodenoscopy (2014); and Tonsillectomy., family history includes AAA (abdominal aortic aneurysm) in her father; Colon cancer (age of onset: 77) in her maternal grandmother; Congestive Heart Failure in her maternal grandmother; Diabetes in her father; Heart attack in her maternal grandfather; Pancreatic cancer in her cousin; Throat cancer in her maternal uncle.,  reports that she has never smoked. She has never used smokeless tobacco. She reports that she does not drink alcohol and does not use drugs.  She has a current medication list which includes the following prescription(s): levonorgestrel-ethinyl estradiol and phentermine. Also, is allergic to corn oil.  Review of Systems  Constitutional: Negative for chills, fever and malaise/fatigue.  HENT: Negative for congestion, sinus pain and sore throat.   Eyes: Negative for blurred vision and pain.  Respiratory: Negative for cough and wheezing.   Cardiovascular: Negative for chest pain and leg swelling.  Gastrointestinal: Negative for abdominal pain, constipation, diarrhea, heartburn, nausea and vomiting.  Genitourinary: Negative for dysuria, frequency, hematuria and urgency.  Musculoskeletal: Negative for back pain, joint pain, myalgias and  neck pain.  Skin: Negative for itching and rash.  Neurological: Negative for dizziness, tremors and weakness.  Endo/Heme/Allergies: Does not bruise/bleed easily.  Psychiatric/Behavioral: Negative for depression. The patient is not nervous/anxious and does not have insomnia.     Objective: BP 110/70   Ht 5\' 1"  (1.549 m)   Wt 163 lb (73.9 kg)   LMP 09/26/2019   BMI 30.80 kg/m  Physical Exam Constitutional:      General: She is not in acute distress.    Appearance: She is well-developed.  Musculoskeletal:        General: Normal range of motion.  Neurological:     Mental Status: She is alert and oriented to person, place, and time.  Skin:    General: Skin is warm and dry.  Vitals reviewed.     ASSESSMENT:  obesity  Plan: Will assist patient in incorporating positive experiences into her life to promote a positive mental attitude.  Education given regarding appropriate lifestyle changes for weight loss, including regular physical activity, healthy coping strategies, caloric restriction, and healthy eating patterns.  Patient is started on prescription appetite suppressants: Phentermine.    The risks and benefits as well as side effects of medication, such as Phenteramine or Tenuate, is discussed.  The pros and cons of suppressing appetite and boosting metabolism is counseled.  Risks of tolerance and addiction discussed.  Use of medicine will be short term.  Pt to call with any negative side effects and agrees to keep follow up appointments.  A total of 20 minutes were spent face-to-face with the patient as well as preparation, review, communication, and documentation during this encounter.   Barnett Applebaum, MD, [facility], [facility] 10/05/2019  2:51 PM

## 2019-11-07 ENCOUNTER — Other Ambulatory Visit: Payer: Self-pay | Admitting: Obstetrics & Gynecology

## 2019-11-22 ENCOUNTER — Telehealth: Payer: Self-pay | Admitting: Obstetrics & Gynecology

## 2019-11-22 ENCOUNTER — Other Ambulatory Visit: Payer: Self-pay

## 2019-11-22 ENCOUNTER — Encounter: Payer: Self-pay | Admitting: Obstetrics & Gynecology

## 2019-11-22 ENCOUNTER — Ambulatory Visit (INDEPENDENT_AMBULATORY_CARE_PROVIDER_SITE_OTHER): Payer: Self-pay | Admitting: Obstetrics & Gynecology

## 2019-11-22 DIAGNOSIS — E669 Obesity, unspecified: Secondary | ICD-10-CM

## 2019-11-22 MED ORDER — PHENTERMINE HCL 37.5 MG PO TABS: ORAL_TABLET | ORAL | 0 refills | Status: DC

## 2019-11-22 NOTE — Telephone Encounter (Signed)
Called and left voicemail for patient to call back to be scheduled. 

## 2019-11-22 NOTE — Progress Notes (Signed)
Virtual Visit via Telephone Note  I connected with Wanda Lloyd on 11/22/19 at  8:20 AM EDT by telephone and verified that I am speaking with the correct person using two identifiers.   I discussed the limitations, risks, security and privacy concerns of performing an evaluation and management service by telephone and the availability of in person appointments. I also discussed with the patient that there may be a patient responsible charge related to this service. The patient expressed understanding and agreed to proceed. She was at home and I was in my office.  History of Present Illness:  Wanda Lloyd is a 49 y.o. who was started on  Current Outpatient Medications on File Prior to Visit  Medication Sig Dispense Refill  . phentermine (ADIPEX-P) 37.5 MG tablet TAKE 1 TABLET BY MOUTH IN THE MORNING 30 tablet 0  approximately 6 weeks ago due to obesity/abnormal weight gain. The patient has lost 6 pounds over the past 6 wks (although has gained some back due to being off medicien for the last two weeks due to scheduling delays) due to meds and lifestyle changes..   She has these side effects: none.  PMHx: She  has a past medical history of Adenomatous colon polyp (2014), Gastritis, History of abnormal cervical Pap smear (2015), Knee pain, and Uterine fibroid. Also,  has a past surgical history that includes Cesarean section (1998/2003); Colonoscopy (11/02/2012); Esophagogastroduodenoscopy (2014); and Tonsillectomy., family history includes AAA (abdominal aortic aneurysm) in her father; Colon cancer (age of onset: 67) in her maternal grandmother; Congestive Heart Failure in her maternal grandmother; Diabetes in her father; Heart attack in her maternal grandfather; Pancreatic cancer in her cousin; Throat cancer in her maternal uncle.,  reports that she has never smoked. She has never used smokeless tobacco. She reports that she does not drink alcohol and does not use drugs.  She has a current medication  list which includes the following prescription(s): levonorgestrel-ethinyl estradiol and phentermine. Also, is allergic to corn oil.  Review of Systems  All other systems reviewed and are negative.     Observations/Objective: No exam today, due to telephone eVisit due to [facility] virus restriction on elective visits and procedures.  Prior visits reviewed along with ultrasounds/labs as indicated. Reported weight from home - 157 lbs.  Assessment and Plan:   ICD-10-CM   1. Obesity (BMI 30-39.9)  E66.9     Assessment: obesity Medication treatment is going well for her.  Plan: Patient is continued/added to prescription appetite suppressants: Phentermine.  Consider addition of Topamax or change to Tenuate based on responsiveness..   Will continue to assist patient in incorporating positive experiences into her life to promote a positive mental attitude.  Education given regarding appropriate lifestyle changes for weight loss, including regular physical activity, healthy coping strategies, caloric restriction, and healthy eating patterns.  The risks and benefits as well as side effects of medication, such as Phenteramine or Tenuate, is discussed.  The pros and cons of suppressing appetite and boosting metabolism is counseled.  Risks of tolerance and addiction discussed.  Use of medicine will be short term.  Pt to call with any negative side effects and agrees to keep follow up appointments.   Follow Up Instructions: Cont Phentermine and follow up 2 mos, sooner if no response from medicine this next month   I discussed the assessment and treatment plan with the patient. The patient was provided an opportunity to ask questions and all were answered. The patient agreed with the plan  and demonstrated an understanding of the instructions.   The patient was advised to call back or seek an in-person evaluation if the symptoms worsen or if the condition fails to improve as anticipated.  I provided 15  minutes of non-face-to-face time during this encounter.   Hoyt Koch, MD

## 2019-11-22 NOTE — Telephone Encounter (Signed)
-----   Message from Gae Dry, MD sent at 11/22/2019  8:23 AM EDT ----- Regarding: Sch Tele F/U 2 mos

## 2019-11-23 NOTE — Telephone Encounter (Signed)
Called and left voicemail for patient to call back to be scheduled. 

## 2019-12-14 ENCOUNTER — Encounter: Payer: Self-pay | Admitting: Obstetrics and Gynecology

## 2019-12-14 ENCOUNTER — Other Ambulatory Visit: Payer: Self-pay

## 2019-12-14 ENCOUNTER — Ambulatory Visit (INDEPENDENT_AMBULATORY_CARE_PROVIDER_SITE_OTHER): Payer: 59 | Admitting: Obstetrics and Gynecology

## 2019-12-14 ENCOUNTER — Other Ambulatory Visit (HOSPITAL_COMMUNITY)
Admission: RE | Admit: 2019-12-14 | Discharge: 2019-12-14 | Disposition: A | Discharge status: Home / Self Care | Payer: 59 | Source: Ambulatory Visit | Attending: Obstetrics and Gynecology | Admitting: Obstetrics and Gynecology

## 2019-12-14 VITALS — BP 110/70 | Ht 61.0 in | Wt 159.0 lb

## 2019-12-14 DIAGNOSIS — Z3041 Encounter for surveillance of contraceptive pills: Secondary | ICD-10-CM | POA: Diagnosis not present

## 2019-12-14 DIAGNOSIS — N921 Excessive and frequent menstruation with irregular cycle: Secondary | ICD-10-CM

## 2019-12-14 DIAGNOSIS — Z1151 Encounter for screening for human papillomavirus (HPV): Secondary | ICD-10-CM | POA: Insufficient documentation

## 2019-12-14 DIAGNOSIS — Z124 Encounter for screening for malignant neoplasm of cervix: Secondary | ICD-10-CM | POA: Diagnosis present

## 2019-12-14 DIAGNOSIS — N898 Other specified noninflammatory disorders of vagina: Secondary | ICD-10-CM

## 2019-12-14 MED ORDER — MICROGESTIN 24 FE 1-20 MG-MCG PO TABS: 1.0000 | ORAL_TABLET | Freq: Every day | ORAL | 1 refills | Status: DC

## 2019-12-14 NOTE — Progress Notes (Signed)
Pcp, No   Chief Complaint  Patient presents with  . Contraception    not sure of new BC    HPI:      [name] is a 49 y.o. [phone] whose LMP was Patient's last menstrual period was 12/04/2019 (exact date)., presents today for BTB with OCPs again. On portia for many yrs. Had AUB 3/21 with normal TSH 4/21, neg GYN u/s with EM=4 mm 3/21. Pt stopped pills for a cycle and then restarted, and AUB resolved. Doing well with monthly menses, lasting 3-4 days mod flow, no BTB, mild dysmen, until this past period. Bleeding lasted 8 days with 5-6 heavy days, changing pads hourly and with clots. Had to stay home from work due to heavy flow. Still with mild dysmen, no BTB. Pt wonders if she needs to change BC vs do something else. Can't go through that again.  Pt is sex active, no new partners. Has noticed a few vaginal lesions for a few months and worried about genital warts. No hx of STDs. Neg pap/neg HPV DNA 10/13/16, neg pap 05/27/18 (no HPV DNA done)  Past Medical History:  Diagnosis Date  . Adenomatous colon polyp 2014   removed on colonoscopy  . Gastritis   . History of abnormal cervical Pap smear 2015  . Knee pain   . Uterine fibroid     Past Surgical History:  Procedure Laterality Date  . CESAREAN SECTION  1998/2003  . COLONOSCOPY  11/02/2012   adenomatous polyp and internal hemmorhoid  . ESOPHAGOGASTRODUODENOSCOPY  2014   gastritis  . TONSILLECTOMY      Family History  Problem Relation Age of Onset  . AAA (abdominal aortic aneurysm) Father   . Diabetes Father   . Colon cancer Maternal Grandmother 34  . Congestive Heart Failure Maternal Grandmother   . Heart attack Maternal Grandfather   . Throat cancer Maternal Uncle   . Pancreatic cancer Cousin 57    Social History   Socioeconomic History  . Marital status: Divorced    Spouse name: Not on file  . Number of children: 3  . Years of education: Not on file  . Highest education level: Not on file  Occupational  History  . Occupation: self employed  Tobacco Use  . Smoking status: Never Smoker  . Smokeless tobacco: Never Used  Vaping Use  . Vaping Use: Never used  Substance and Sexual Activity  . Alcohol use: No  . Drug use: No  . Sexual activity: Yes    Partners: Male    Birth control/protection: Pill  Other Topics Concern  . Not on file  Social History Narrative  . Not on file   Social Determinants of Health   Financial Resource Strain:   . Difficulty of Paying Living Expenses: Not on file  Food Insecurity:   . Worried About Charity fundraiser in the Last Year: Not on file  . Ran Out of Food in the Last Year: Not on file  Transportation Needs:   . Lack of Transportation (Medical): Not on file  . Lack of Transportation (Non-Medical): Not on file  Physical Activity:   . Days of Exercise per Week: Not on file  . Minutes of Exercise per Session: Not on file  Stress:   . Feeling of Stress : Not on file  Social Connections:   . Frequency of Communication with Friends and Family: Not on file  . Frequency of Social Gatherings with Friends and Family: Not  on file  . Attends Religious Services: Not on file  . Active Member of Clubs or Organizations: Not on file  . Attends Archivist Meetings: Not on file  . Marital Status: Not on file  Intimate Partner Violence:   . Fear of Current or Ex-Partner: Not on file  . Emotionally Abused: Not on file  . Physically Abused: Not on file  . Sexually Abused: Not on file    Outpatient Medications Prior to Visit  Medication Sig Dispense Refill  . famotidine (PEPCID) 20 MG tablet Take by mouth.    . phentermine (ADIPEX-P) 37.5 MG tablet TAKE 1 TABLET BY MOUTH IN THE MORNING 30 tablet 0  . levonorgestrel-ethinyl estradiol (PORTIA-28) 0.15-30 MG-MCG tablet Take 1 tablet by mouth daily. 3 Package 4   No facility-administered medications prior to visit.      ROS:  Review of Systems  Constitutional: Negative for fever.    Gastrointestinal: Negative for blood in stool, constipation, diarrhea, nausea and vomiting.  Genitourinary: Positive for menstrual problem. Negative for dyspareunia, dysuria, flank pain, frequency, hematuria, urgency, vaginal bleeding, vaginal discharge and vaginal pain.  Musculoskeletal: Negative for back pain.  Skin: Negative for rash.   BREAST: No symptoms   OBJECTIVE:   Vitals:  BP 110/70   Ht 5\' 1"  (1.549 m)   Wt 159 lb (72.1 kg)   LMP 12/04/2019 (Exact Date)   BMI 30.04 kg/m   Physical Exam Vitals reviewed.  Constitutional:      Appearance: She is well-developed.  Pulmonary:     Effort: Pulmonary effort is normal.  Genitourinary:    Pubic Area: No rash.      Labia:        Right: No rash, tenderness or lesion.        Left: No rash, tenderness or lesion.      Vagina: Normal. No vaginal discharge, erythema or tenderness.     Cervix: Normal.     Uterus: Normal. Not enlarged and not tender.      Adnexa: Right adnexa normal and left adnexa normal.       Right: No mass or tenderness.         Left: No mass or tenderness.      Musculoskeletal:        General: Normal range of motion.     Cervical back: Normal range of motion.  Skin:    General: Skin is warm and dry.  Neurological:     General: No focal deficit present.     Mental Status: She is alert and oriented to person, place, and time.  Psychiatric:        Mood and Affect: Mood normal.        Behavior: Behavior normal.        Thought Content: Thought content normal.        Judgment: Judgment normal.     Assessment/Plan: Breakthrough bleeding on OCPs--had neg TSH and GYN u/s 6 months ago. Change OCPs to see if sx improve. Rx lomedia. F/u prn.   Encounter for surveillance of contraceptive pills - Plan: Norethindrone Acetate-Ethinyl Estrad-FE (MICROGESTIN 24 FE) 1-20 MG-MCG(24) tablet; OCP Rx eRxd  Vaginal lesions--question flat genital warts with 1 skin tag vs warts. Treated with podophyllin, wash in 4-6  hrs. F/u prn.   Cervical cancer screening - Plan: Cytology - PAP  Screening for HPV (human papillomavirus) - Plan: Cytology - PAP    Meds ordered this encounter  Medications  . Norethindrone Acetate-Ethinyl Estrad-FE (MICROGESTIN 24  FE) 1-20 MG-MCG(24) tablet    Sig: Take 1 tablet by mouth daily.    Dispense:  84 tablet    Refill:  1    Order Specific Question:   Supervising Provider    Answer:   Gae Dry [[id number]]      Return if symptoms worsen or fail to improve.  Lucianna Ostlund B. Charleston Vierling, PA-C 12/14/2019 5:08 PM

## 2019-12-14 NOTE — Patient Instructions (Signed)
I value your feedback and entrusting us with your care. If you get a [facility] patient survey, I would appreciate you taking the time to let us know about your experience today. Thank you!  As of March 03, 2019, your lab results will be released to your MyChart immediately, before I even have a chance to see them. Please give me time to review them and contact you if there are any abnormalities. Thank you for your patience.  

## 2019-12-19 LAB — CYTOLOGY - PAP
Comment: NEGATIVE
Diagnosis: UNDETERMINED — AB
High risk HPV: NEGATIVE

## 2019-12-28 ENCOUNTER — Telehealth: Payer: Self-pay

## 2019-12-28 NOTE — Telephone Encounter (Signed)
Pt calling; wants PH's nurse to call her back.  [phone]  Pt states she wants refill of Adipex.

## 2019-12-29 ENCOUNTER — Other Ambulatory Visit: Payer: Self-pay | Admitting: Obstetrics & Gynecology

## 2019-12-29 MED ORDER — PHENTERMINE HCL 37.5 MG PO TABS: ORAL_TABLET | ORAL | 0 refills | Status: DC

## 2019-12-29 NOTE — Telephone Encounter (Signed)
Patient is scheduled for 01/27/20 with RPH  

## 2020-01-27 ENCOUNTER — Ambulatory Visit (INDEPENDENT_AMBULATORY_CARE_PROVIDER_SITE_OTHER): Payer: Self-pay | Admitting: Obstetrics & Gynecology

## 2020-01-27 ENCOUNTER — Other Ambulatory Visit: Payer: Self-pay

## 2020-01-27 ENCOUNTER — Encounter: Payer: Self-pay | Admitting: Obstetrics & Gynecology

## 2020-01-27 VITALS — BP 100/70 | Ht 61.0 in | Wt 159.0 lb

## 2020-01-27 DIAGNOSIS — E669 Obesity, unspecified: Secondary | ICD-10-CM

## 2020-01-27 MED ORDER — DIETHYLPROPION HCL ER 75 MG PO TB24
1.0000 | ORAL_TABLET | Freq: Every day | ORAL | 1 refills | Status: DC
Start: 2020-01-27 — End: 2020-02-23

## 2020-01-27 MED ORDER — TOPIRAMATE 25 MG PO TABS: 25.0000 mg | ORAL_TABLET | Freq: Two times a day (BID) | ORAL | 5 refills | Status: DC

## 2020-01-27 NOTE — Progress Notes (Signed)
  History of Present Illness:  Wanda Lloyd is a 49 y.o. who was started on Phentermine approximately 2 months ago due to obesity/abnormal weight gain. The patient reports no significant weight change..   She has these side effects: none.  She has done well w this medicine in past.  She reports continued appetite suppression.  PMHx: She  has a past medical history of Adenomatous colon polyp (2014), Gastritis, History of abnormal cervical Pap smear (2015), Knee pain, and Uterine fibroid. Also,  has a past surgical history that includes Cesarean section (1998/2003); Colonoscopy (11/02/2012); Esophagogastroduodenoscopy (2014); and Tonsillectomy., family history includes AAA (abdominal aortic aneurysm) in her father; Colon cancer (age of onset: 35) in her maternal grandmother; Congestive Heart Failure in her maternal grandmother; Diabetes in her father; Heart attack in her maternal grandfather; Pancreatic cancer (age of onset: 6) in her cousin; Throat cancer in her maternal uncle.,  reports that she has never smoked. She has never used smokeless tobacco. She reports that she does not drink alcohol and does not use drugs.  She has a current medication list which includes the following prescription(s): famotidine, microgestin 24 fe, and phentermine. Also, is allergic to corn oil.  Review of Systems  All other systems reviewed and are negative.   Physical Exam:  BP 100/70   Ht 5\' 1"  (1.549 m)   Wt 159 lb (72.1 kg)   LMP 01/09/2020   BMI 30.04 kg/m  Body mass index is 30.04 kg/m. Filed Weights   01/27/20 0852  Weight: 159 lb (72.1 kg)    Physical Exam Constitutional:      General: She is not in acute distress.    Appearance: She is well-developed.  Musculoskeletal:        General: Normal range of motion.  Neurological:     Mental Status: She is alert and oriented to person, place, and time.  Skin:    General: Skin is warm and dry.  Vitals reviewed.     Assessment: obesity Medication  treatment is going marginally for her.  Plan: Patient is continued/added to prescription appetite suppressants: with a change to Tenuate and the addition of Topamax 25 mg BID and also diet and exercised counseling.   Will continue to assist patient in incorporating positive experiences into her life to promote a positive mental attitude.  Education given regarding appropriate lifestyle changes for weight loss, including regular physical activity, healthy coping strategies, caloric restriction, and healthy eating patterns.  The risks and benefits as well as side effects of medication, such as Phenteramine or Tenuate, is discussed.  The pros and cons of suppressing appetite and boosting metabolism is counseled.  Risks of tolerance and addiction discussed.  Use of medicine will be short term.  Pt to call with any negative side effects and agrees to keep follow up appointments.  A total of 15 minutes were spent face-to-face with the patient as well as preparation, review, communication, and documentation during this encounter.   Barnett Applebaum, MD, [facility], [facility] 01/27/2020  9:27 AM

## 2020-02-03 ENCOUNTER — Telehealth: Payer: Self-pay

## 2020-02-03 NOTE — Telephone Encounter (Signed)
Patient has question about rx [facility] sent in. [phone]

## 2020-02-03 NOTE — Telephone Encounter (Signed)
Spoke w/patient. She thought she was staying on ADIPEX and RPH was adding something else. Pharmacy advised her of new rx for Tenuate. Advised per 01/27/20 visit note [facility] made change to Tenuate with addition of Topamax. Patient understands and will p/u and give it a try.

## 2020-02-23 ENCOUNTER — Other Ambulatory Visit: Payer: Self-pay | Admitting: Obstetrics & Gynecology

## 2020-02-23 MED ORDER — PHENTERMINE HCL 37.5 MG PO TABS
ORAL_TABLET | ORAL | 0 refills | Status: DC
Start: 2020-02-23 — End: 2020-04-11

## 2020-02-23 NOTE — Telephone Encounter (Signed)
Patient states she is tired and irritable on the ADIPEX. She would like to switch back to the Phentermine. She is requesting 1 month w/1 refill.

## 2020-02-23 NOTE — Telephone Encounter (Signed)
Continue with Topamax as switching back to Phentermine

## 2020-02-24 NOTE — Telephone Encounter (Signed)
Patient aware.

## 2020-04-08 NOTE — Progress Notes (Deleted)
    Pcp, No   No chief complaint on file.   HPI:      [name] is a 50 y.o. [phone] whose LMP was No LMP recorded., presents today for ***    Past Medical History:  Diagnosis Date  . Adenomatous colon polyp 2014   removed on colonoscopy  . Gastritis   . History of abnormal cervical Pap smear 2015  . Knee pain   . Uterine fibroid     Past Surgical History:  Procedure Laterality Date  . CESAREAN SECTION  1998/2003  . COLONOSCOPY  11/02/2012   adenomatous polyp and internal hemmorhoid  . ESOPHAGOGASTRODUODENOSCOPY  2014   gastritis  . TONSILLECTOMY      Family History  Problem Relation Age of Onset  . AAA (abdominal aortic aneurysm) Father   . Diabetes Father   . Colon cancer Maternal Grandmother 9  . Congestive Heart Failure Maternal Grandmother   . Heart attack Maternal Grandfather   . Throat cancer Maternal Uncle   . Pancreatic cancer Cousin 65    Social History   Socioeconomic History  . Marital status: Divorced    Spouse name: Not on file  . Number of children: 3  . Years of education: Not on file  . Highest education level: Not on file  Occupational History  . Occupation: self employed  Tobacco Use  . Smoking status: Never Smoker  . Smokeless tobacco: Never Used  Vaping Use  . Vaping Use: Never used  Substance and Sexual Activity  . Alcohol use: No  . Drug use: No  . Sexual activity: Yes    Partners: Male    Birth control/protection: Pill  Other Topics Concern  . Not on file  Social History Narrative  . Not on file   Social Determinants of Health   Financial Resource Strain: Not on file  Food Insecurity: Not on file  Transportation Needs: Not on file  Physical Activity: Not on file  Stress: Not on file  Social Connections: Not on file  Intimate Partner Violence: Not on file    Outpatient Medications Prior to Visit  Medication Sig Dispense Refill  . famotidine (PEPCID) 20 MG tablet Take by mouth.    . Norethindrone  Acetate-Ethinyl Estrad-FE (MICROGESTIN 24 FE) 1-20 MG-MCG(24) tablet Take 1 tablet by mouth daily. 84 tablet 1  . phentermine (ADIPEX-P) 37.5 MG tablet TAKE 1 TABLET BY MOUTH IN THE MORNING 30 tablet 0  . topiramate (TOPAMAX) 25 MG tablet Take 1 tablet (25 mg total) by mouth 2 (two) times daily. 60 tablet 5   No facility-administered medications prior to visit.      ROS:  Review of Systems BREAST: No symptoms   OBJECTIVE:   Vitals:  There were no vitals taken for this visit.  Physical Exam  Results: No results found for this or any previous visit (from the past 24 hour(s)).   Assessment/Plan: No diagnosis found.    No orders of the defined types were placed in this encounter.     No follow-ups on file.  Gwen Sarvis B. Tiawanna Luchsinger, PA-C 04/08/2020 3:13 PM

## 2020-04-09 ENCOUNTER — Ambulatory Visit: Payer: 59 | Admitting: Obstetrics and Gynecology

## 2020-04-10 NOTE — Progress Notes (Signed)
Pcp, No   Chief Complaint  Patient presents with  . Vaginal Discharge    Odor, itchiness and irritation x 1 week  . Pelvic Pain    Left side only, denies uti sx x 1 week    HPI:      Wanda Lloyd is a 50 y.o. [id number] whose LMP was Patient's last menstrual period was 03/28/2020 (approximate)., presents today for increased vaginal d/c with odor, mild irritation for the past wk. Has also had mild LLQ discomfort with sx. She takes baths with scented epson salts. No hx of BV in past. No meds to treat, no recent abx. No urin sx, LBP, fevers. She is sex active, no new partners.   Seen 9/21 for BTB with OCPs. Changed to lomedia and BTB resolved. Menses either very light or absent on placebo pills. Pt doing well.   Also had ext vaginal lesions, very small at the time. Hard to tell if ext warts vs skin tags. Treated with podophyllin without relief. Lesions much larger now.  ASCUS pap with neg HPV DNA 9/21.    Past Medical History:  Diagnosis Date  . Adenomatous colon polyp 2014   removed on colonoscopy  . Gastritis   . History of abnormal cervical Pap smear 2015  . Knee pain   . Uterine fibroid     Past Surgical History:  Procedure Laterality Date  . CESAREAN SECTION  1998/2003  . COLONOSCOPY  11/02/2012   adenomatous polyp and internal hemmorhoid  . ESOPHAGOGASTRODUODENOSCOPY  2014   gastritis  . TONSILLECTOMY      Family History  Problem Relation Age of Onset  . AAA (abdominal aortic aneurysm) Father   . Diabetes Father   . Colon cancer Maternal Grandmother 6  . Congestive Heart Failure Maternal Grandmother   . Heart attack Maternal Grandfather   . Throat cancer Maternal Uncle   . Pancreatic cancer Cousin 69    Social History   Socioeconomic History  . Marital status: Divorced    Spouse name: Not on file  . Number of children: 3  . Years of education: Not on file  . Highest education level: Not on file  Occupational History  . Occupation: self employed   Tobacco Use  . Smoking status: Never Smoker  . Smokeless tobacco: Never Used  Vaping Use  . Vaping Use: Never used  Substance and Sexual Activity  . Alcohol use: No  . Drug use: No  . Sexual activity: Yes    Partners: Male    Birth control/protection: Pill  Other Topics Concern  . Not on file  Social History Narrative  . Not on file   Social Determinants of Health   Financial Resource Strain: Not on file  Food Insecurity: Not on file  Transportation Needs: Not on file  Physical Activity: Not on file  Stress: Not on file  Social Connections: Not on file  Intimate Partner Violence: Not on file    Outpatient Medications Prior to Visit  Medication Sig Dispense Refill  . Norethindrone Acetate-Ethinyl Estrad-FE (MICROGESTIN 24 FE) 1-20 MG-MCG(24) tablet Take 1 tablet by mouth daily. 84 tablet 1  . topiramate (TOPAMAX) 25 MG tablet Take 1 tablet (25 mg total) by mouth 2 (two) times daily. (Patient not taking: Reported on 04/11/2020) 60 tablet 5  . famotidine (PEPCID) 20 MG tablet Take by mouth.    . phentermine (ADIPEX-P) 37.5 MG tablet TAKE 1 TABLET BY MOUTH IN THE MORNING 30 tablet 0  No facility-administered medications prior to visit.      ROS:  Review of Systems  Constitutional: Negative for fever, malaise/fatigue and weight loss.  Gastrointestinal: Negative for blood in stool, constipation, diarrhea, nausea and vomiting.  Genitourinary: Positive for genital sores and vaginal discharge. Negative for dyspareunia, dysuria, flank pain, frequency, hematuria, urgency, vaginal bleeding and vaginal pain.  Musculoskeletal: Negative for back pain.  Skin: Negative for itching and rash.    OBJECTIVE:   Vitals:  BP 100/70   Ht 5\' 1"  (1.549 m)   Wt 161 lb (73 kg)   LMP 03/28/2020 (Approximate)   BMI 30.42 kg/m   Physical Exam Vitals reviewed.  Constitutional:      Appearance: She is well-developed.  Pulmonary:     Effort: Pulmonary effort is normal.   Genitourinary:    Pubic Area: No rash.      Labia:        Right: Lesion present. No rash or tenderness.        Left: Lesion present. No rash or tenderness.      Vagina: Normal. No vaginal discharge, erythema or tenderness.     Cervix: Normal.     Uterus: Normal. Not enlarged and not tender.      Adnexa: Right adnexa normal and left adnexa normal.       Right: No mass or tenderness.         Left: No mass or tenderness.      Musculoskeletal:        General: Normal range of motion.     Cervical back: Normal range of motion.  Skin:    General: Skin is warm and dry.  Neurological:     General: No focal deficit present.     Mental Status: She is alert and oriented to person, place, and time.  Psychiatric:        Mood and Affect: Mood normal.        Behavior: Behavior normal.        Thought Content: Thought content normal.        Judgment: Judgment normal.     Results: Results for orders placed or performed in visit on 04/11/20 (from the past 24 hour(s))  POCT Wet Prep with KOH     Status: Abnormal   Collection Time: 04/11/20  9:23 AM  Result Value Ref Range   Trichomonas, UA Negative    Clue Cells Wet Prep HPF POC pos    Epithelial Wet Prep HPF POC     Yeast Wet Prep HPF POC neg    Bacteria Wet Prep HPF POC     RBC Wet Prep HPF POC     WBC Wet Prep HPF POC     KOH Prep POC Positive (A) Negative     Assessment/Plan: Bacterial vaginosis - Plan: metroNIDAZOLE (FLAGYL) 500 MG tablet, POCT Wet Prep with KOH; pos sx and wet prep. Rx flagyl, no EtOH. D/C scented baths. Will RF if sx recur. F/u prn.   Genital warts - Plan: imiquimod (ALDARA) 5 % cream; no relief with podophyllin tx; lesions larger now. TCA still on back order. Will try aldara. Rx eRxd. Wash in AM. Can see derm for laser or freezing if sx persist.    Meds ordered this encounter  Medications  . imiquimod (ALDARA) 5 % cream    Sig: Apply topically 3 (three) times a week. Apply until total clearance or maximum  of 16 weeks    Dispense:  24 each    Refill:  3    Order Specific Question:   Supervising Provider    Answer:   Gae Dry [id number]  . metroNIDAZOLE (FLAGYL) 500 MG tablet    Sig: Take 1 tablet (500 mg total) by mouth 2 (two) times daily for 7 days.    Dispense:  14 tablet    Refill:  0    Order Specific Question:   Supervising Provider    Answer:   Gae Dry [id number]      Return if symptoms worsen or fail to improve.  Alonnah Lampkins B. Takeo Harts, PA-C 04/11/2020 9:25 AM

## 2020-04-11 ENCOUNTER — Encounter: Payer: Self-pay | Admitting: Obstetrics and Gynecology

## 2020-04-11 ENCOUNTER — Ambulatory Visit (INDEPENDENT_AMBULATORY_CARE_PROVIDER_SITE_OTHER): Payer: 59 | Admitting: Obstetrics and Gynecology

## 2020-04-11 ENCOUNTER — Other Ambulatory Visit: Payer: Self-pay

## 2020-04-11 VITALS — BP 100/70 | Ht 61.0 in | Wt 161.0 lb

## 2020-04-11 DIAGNOSIS — N760 Acute vaginitis: Secondary | ICD-10-CM

## 2020-04-11 DIAGNOSIS — B9689 Other specified bacterial agents as the cause of diseases classified elsewhere: Secondary | ICD-10-CM

## 2020-04-11 DIAGNOSIS — A630 Anogenital (venereal) warts: Secondary | ICD-10-CM

## 2020-04-11 LAB — POCT WET PREP WITH KOH
Clue Cells Wet Prep HPF POC: POSITIVE
KOH Prep POC: POSITIVE — AB
Trichomonas, UA: NEGATIVE
Yeast Wet Prep HPF POC: NEGATIVE

## 2020-04-11 MED ORDER — METRONIDAZOLE 500 MG PO TABS: 500.0000 mg | ORAL_TABLET | Freq: Two times a day (BID) | ORAL | 0 refills | Status: AC

## 2020-04-11 MED ORDER — IMIQUIMOD 5 % EX CREA: TOPICAL_CREAM | CUTANEOUS | 3 refills | Status: DC

## 2020-04-11 NOTE — Patient Instructions (Signed)
I value your feedback and you entrusting us with your care. If you get a [facility] patient survey, I would appreciate you taking the time to let us know about your experience today. Thank you! ? ? ?

## 2020-04-27 ENCOUNTER — Telehealth: Payer: Self-pay

## 2020-04-27 NOTE — Telephone Encounter (Signed)
Pt calling triage with some questions for ABC/GA. Tried to call pt back, went straight to vm, lm with pt to call back so I can get a little more information before sending message

## 2020-05-07 ENCOUNTER — Other Ambulatory Visit: Payer: Self-pay | Admitting: Obstetrics and Gynecology

## 2020-05-07 MED ORDER — LEVONORGESTREL-ETHINYL ESTRAD 0.15-30 MG-MCG PO TABS: 1.0000 | ORAL_TABLET | Freq: Every day | ORAL | 0 refills | Status: DC

## 2020-05-07 NOTE — Telephone Encounter (Signed)
Rx portia eRxd. Pt due for annual 3/22. Last one 3/21. Has been seen since but due for mammo, etc.

## 2020-05-07 NOTE — Progress Notes (Signed)
Rx RF portia due to acne with lomedia. Annual due 3/22. Last one with [facility]G 3/20.

## 2020-05-07 NOTE — Telephone Encounter (Signed)
Pt called back to follow up on call from 04/27/20. Advised we called back and left msg, says she never received anything. Pt says current BC she is on now is giving her acne issues (chest, chin, nose). Would like to go back to the one ABC had her on for 10-12 yrs, PORTIA. Pls advise.

## 2020-05-07 NOTE — Telephone Encounter (Signed)
Pt aware. Will call back to schedule appt.

## 2020-08-02 ENCOUNTER — Other Ambulatory Visit: Payer: Self-pay | Admitting: Obstetrics and Gynecology

## 2020-08-02 NOTE — Telephone Encounter (Signed)
Pt has annual sched 9/22. She was past due 3/21, so needs to sched before 9/22. Has been seen for other issues but due for annual. Thx

## 2020-08-02 NOTE — Telephone Encounter (Signed)
Pls call pt to schedule annual sooner. Per ABCs notes in this msg, she was due March 2022. Pls send msg to me once annual has been schedule so I can send [facility] RF today.

## 2020-08-03 ENCOUNTER — Telehealth: Payer: Self-pay

## 2020-08-03 MED ORDER — LEVONORGESTREL-ETHINYL ESTRAD 0.15-30 MG-MCG PO TABS: 1.0000 | ORAL_TABLET | Freq: Every day | ORAL | 0 refills | Status: DC

## 2020-08-03 NOTE — Addendum Note (Signed)
Addended by: Drenda Freeze on: 08/03/2020 03:11 PM   Modules accepted: Orders

## 2020-08-03 NOTE — Telephone Encounter (Signed)
Pt aware.

## 2020-08-03 NOTE — Telephone Encounter (Signed)
Pt's annual is 08/22/20. [facility] RF sent.

## 2020-08-03 NOTE — Telephone Encounter (Signed)
Patient was scheduled from 12/17/20 for annul exam. Was advise to schedule early then that date so patient c ould receive an refill on her birth control. Patient is scheduled for 09/21/20 with ABC. Patient reports completely out of medication would like refill sent in today please.

## 2020-08-22 ENCOUNTER — Ambulatory Visit: Payer: 59 | Admitting: Obstetrics and Gynecology

## 2020-08-22 NOTE — Progress Notes (Deleted)
PCP: Pcp, No   No chief complaint on file. BV and warts 1/22?  HPI:      [name] is a 50 y.o. [phone] whose LMP was No LMP recorded., presents today for her annual examination.  Her menses are {norm/abn:715}, lasting {number:22536} days.  Dysmenorrhea {dysmen:716}. She {does:18564} have intermenstrual bleeding. She {does:18564} have vasomotor sx.   Sex activity: {sex active:315163}. She {does:18564} have vaginal dryness.  Last Pap: 12/14/19  Results were: atypical squamous cellularity of undetermined significance (ASCUS) /neg HPV DNA, repeat due 2024 Hx of STDs: ext HPV  Last mammogram: 10/05/18  Results were: normal--routine follow-up in 12 months There is no FH of breast cancer. There is no FH of ovarian cancer. The patient {does:18564} do self-breast exams.  Colonoscopy: {hx:15363}  Repeat due after 10*** years.   Tobacco use: {tob:20664} Alcohol use: {Alcohol:11675} Exercise: {exercise:31265}  She {does:18564} get adequate calcium and Vitamin D in her diet.  Labs with PCP.   Past Medical History:  Diagnosis Date  . Adenomatous colon polyp 2014   removed on colonoscopy  . Gastritis   . History of abnormal cervical Pap smear 2015  . Knee pain   . Uterine fibroid     Past Surgical History:  Procedure Laterality Date  . CESAREAN SECTION  1998/2003  . COLONOSCOPY  11/02/2012   adenomatous polyp and internal hemmorhoid  . ESOPHAGOGASTRODUODENOSCOPY  2014   gastritis  . TONSILLECTOMY      Family History  Problem Relation Age of Onset  . AAA (abdominal aortic aneurysm) Father   . Diabetes Father   . Colon cancer Maternal Grandmother 2  . Congestive Heart Failure Maternal Grandmother   . Heart attack Maternal Grandfather   . Throat cancer Maternal Uncle   . Pancreatic cancer Cousin 82    Social History   Socioeconomic History  . Marital status: Divorced    Spouse name: Not on file  . Number of children: 3  . Years of education: Not on file  .  Highest education level: Not on file  Occupational History  . Occupation: self employed  Tobacco Use  . Smoking status: Never Smoker  . Smokeless tobacco: Never Used  Vaping Use  . Vaping Use: Never used  Substance and Sexual Activity  . Alcohol use: No  . Drug use: No  . Sexual activity: Yes    Partners: Male    Birth control/protection: Pill  Other Topics Concern  . Not on file  Social History Narrative  . Not on file   Social Determinants of Health   Financial Resource Strain: Not on file  Food Insecurity: Not on file  Transportation Needs: Not on file  Physical Activity: Not on file  Stress: Not on file  Social Connections: Not on file  Intimate Partner Violence: Not on file     Current Outpatient Medications:  .  imiquimod (ALDARA) 5 % cream, Apply topically 3 (three) times a week. Apply until total clearance or maximum of 16 weeks, Disp: 24 each, Rfl: 3 .  levonorgestrel-ethinyl estradiol (PORTIA-28) 0.15-30 MG-MCG tablet, Take 1 tablet by mouth daily., Disp: 28 tablet, Rfl: 0 .  topiramate (TOPAMAX) 25 MG tablet, Take 1 tablet (25 mg total) by mouth 2 (two) times daily. (Patient not taking: Reported on 04/11/2020), Disp: 60 tablet, Rfl: 5     ROS:  Review of Systems BREAST: No symptoms    Objective: There were no vitals taken for this visit.   OBGyn Exam  Results:  No results found for this or any previous visit (from the past 24 hour(s)).  Assessment/Plan:  No diagnosis found.   No orders of the defined types were placed in this encounter.           GYN counsel {counseling:16159}    F/U  No follow-ups on file.  Johnson Arizola B. Hatsuko Bizzarro, PA-C 08/22/2020 10:34 AM

## 2020-09-12 NOTE — Progress Notes (Addendum)
PCP: Pcp, No   Chief Complaint  Patient presents with   Gynecologic Exam    No concerns    HPI:      [name] is a 50 y.o. [id number] whose LMP was Patient's last menstrual period was 08/16/2020 (approximate)., presents today for her annual examination.  Her menses are regular every 28-30 days, lasting 5 days.  Dysmenorrhea mild, occurring first 1-2 days of flow. She does not have intermenstrual bleeding.  Sex activity: single partner, contraception - OCP (estrogen/progesterone).  Last Pap: 12/14/19  Results were: atypical squamous cellularity of undetermined significance (ASCUS) /neg HPV DNA, repeat due today Hx of STDs: ext HPV; lesions treated with podophyllin without relief 2021. No TCA available so treated with aldara 1/22. Pt noticed improvement if she uses as directed but often forgets to do tx.   Treated for BV 1/22 with temporary relief. Notices vaginal odor again.   Last mammogram: 10/05/18  Results were: normal--routine follow-up in 12 months There is no FH of breast cancer. There is no FH of ovarian cancer. The patient does do self-breast exams.  Colonoscopy: 2022 at [facility];  Repeat due after 7 years. FH colon cancer in her MGM and pancreatic cancer in her mat cousin (deceased). I recommended genetic testing for pt's mat side since she doesn't qualify.   Tobacco use: The patient denies current or previous tobacco use. Alcohol use: social drinker No drug use. Exercise: moderately active  She does not get adequate calcium and Vitamin D in her diet.  Labs with PCP. Pt has been unable to lose wt. Is doing 1200-1400 cal daily and lower carb without change. Exercising 5-6 times weekly. Just creeping up. Had normal TSH last yr.   Past Medical History:  Diagnosis Date   Adenomatous colon polyp 2014   removed on colonoscopy   Gastritis    History of abnormal cervical Pap smear 2015   Knee pain    Uterine fibroid     Past Surgical History:  Procedure Laterality Date    CESAREAN SECTION  1998/2003   COLONOSCOPY  11/02/2012   adenomatous polyp and internal hemmorhoid   ESOPHAGOGASTRODUODENOSCOPY  2014   gastritis   TONSILLECTOMY      Family History  Problem Relation Age of Onset   AAA (abdominal aortic aneurysm) Father    Diabetes Father    Colon cancer Maternal Grandmother 47   Congestive Heart Failure Maternal Grandmother    Heart attack Maternal Grandfather    Throat cancer Maternal Uncle    Pancreatic cancer Cousin 55    Social History   Socioeconomic History   Marital status: Divorced    Spouse name: Not on file   Number of children: 3   Years of education: Not on file   Highest education level: Not on file  Occupational History   Occupation: self employed  Tobacco Use   Smoking status: Never   Smokeless tobacco: Never  Vaping Use   Vaping Use: Never used  Substance and Sexual Activity   Alcohol use: No   Drug use: No   Sexual activity: Yes    Partners: Male    Birth control/protection: Pill  Other Topics Concern   Not on file  Social History Narrative   Not on file   Social Determinants of Health   Financial Resource Strain: Not on file  Food Insecurity: Not on file  Transportation Needs: Not on file  Physical Activity: Not on file  Stress: Not on file  Social  Connections: Not on file  Intimate Partner Violence: Not on file     Current Outpatient Medications:    imiquimod (ALDARA) 5 % cream, Apply topically 3 (three) times a week. Apply until total clearance or maximum of 16 weeks, Disp: 24 each, Rfl: 3   levonorgestrel-ethinyl estradiol (PORTIA-28) 0.15-30 MG-MCG tablet, Take 1 tablet by mouth daily., Disp: 84 tablet, Rfl: 3     ROS:  Review of Systems  Constitutional:  Negative for fatigue, fever and unexpected weight change.  Respiratory:  Negative for cough, shortness of breath and wheezing.   Cardiovascular:  Negative for chest pain, palpitations and leg swelling.  Gastrointestinal:  Negative for  blood in stool, constipation, diarrhea, nausea and vomiting.  Endocrine: Negative for cold intolerance, heat intolerance and polyuria.  Genitourinary:  Negative for dyspareunia, dysuria, flank pain, frequency, genital sores, hematuria, menstrual problem, pelvic pain, urgency, vaginal bleeding, vaginal discharge and vaginal pain.  Musculoskeletal:  Negative for back pain, joint swelling and myalgias.  Skin:  Negative for rash.  Neurological:  Negative for dizziness, syncope, light-headedness, numbness and headaches.  Hematological:  Negative for adenopathy.  Psychiatric/Behavioral:  Negative for agitation, confusion, sleep disturbance and suicidal ideas. The patient is not nervous/anxious.   BREAST: No symptoms    Objective: BP 100/70   Ht 5\' 1"  (1.549 m)   Wt 170 lb (77.1 kg)   LMP 08/16/2020 (Approximate)   BMI 32.12 kg/m    Physical Exam Constitutional:      Appearance: She is well-developed.  Genitourinary:     Vulva normal.     Right Labia: lesions.     Right Labia: No rash or tenderness.    Left Labia: lesions.     Left Labia: No tenderness or rash.       No vaginal discharge, erythema or tenderness.      Right Adnexa: not tender and no mass present.    Left Adnexa: not tender and no mass present.    No cervical friability or polyp.     Uterus is not enlarged or tender.  Breasts:    Right: No mass, nipple discharge, skin change or tenderness.     Left: No mass, nipple discharge, skin change or tenderness.  Neck:     Thyroid: No thyromegaly.  Cardiovascular:     Rate and Rhythm: Normal rate and regular rhythm.     Heart sounds: Normal heart sounds. No murmur heard. Pulmonary:     Effort: Pulmonary effort is normal.     Breath sounds: Normal breath sounds.  Abdominal:     Palpations: Abdomen is soft.     Tenderness: There is no abdominal tenderness. There is no guarding or rebound.  Musculoskeletal:        General: Normal range of motion.     Cervical back:  Normal range of motion.  Lymphadenopathy:     Cervical: No cervical adenopathy.  Neurological:     General: No focal deficit present.     Mental Status: She is alert and oriented to person, place, and time.     Cranial Nerves: No cranial nerve deficit.  Skin:    General: Skin is warm and dry.  Psychiatric:        Mood and Affect: Mood normal.        Behavior: Behavior normal.        Thought Content: Thought content normal.        Judgment: Judgment normal.  Vitals reviewed.    Results: Results for orders  placed or performed in visit on 09/13/20 (from the past 24 hour(s))  POCT Wet Prep with KOH     Status: Normal   Collection Time: 09/13/20 12:14 PM  Result Value Ref Range   Trichomonas, UA Negative    Clue Cells Wet Prep HPF POC neg    Epithelial Wet Prep HPF POC     Yeast Wet Prep HPF POC neg    Bacteria Wet Prep HPF POC     RBC Wet Prep HPF POC     WBC Wet Prep HPF POC     KOH Prep POC Negative Negative    Assessment/Plan:  Encounter for annual routine gynecological examination  Cervical cancer screening - Plan: Cytology - PAP  Screening for HPV (human papillomavirus) - Plan: Cytology - PAP  ASCUS of cervix with negative high risk HPV - Plan: Cytology - PAP; repeat today. Will f/u if abn.  Encounter for surveillance of contraceptive pills - Plan: levonorgestrel-ethinyl estradiol (PORTIA-28) 0.15-30 MG-MCG tablet; OCP RF  Encounter for screening mammogram for malignant neoplasm of breast - Plan: MM 3D SCREEN BREAST BILATERAL; pt to sched mammo  Weight gain - Discussed low carb diet changes to see if helps. F/u prn. Declines updated TSH screening since normal last yr (and that is appropriate).   Vaginal odor - Plan: POCT Wet Prep with KOH; neg wet prep. Try repHresh. F/u prn. Will RF flagyl prn.   Genital warts--2 treated with histofreeze today, wound care discussed. Pt has plans this wknd and we don't want treated areas to be problematic. Will see if the lesions  resolve. If so, pt will RTO for further tx. If not, will RF aldara crm. F/u prn.    Meds ordered this encounter  Medications   levonorgestrel-ethinyl estradiol (PORTIA-28) 0.15-30 MG-MCG tablet    Sig: Take 1 tablet by mouth daily.    Dispense:  84 tablet    Refill:  3    Order Specific Question:   Supervising Provider    Answer:   Gae Dry [[id number]]            GYN counsel mammography screening, adequate intake of calcium and vitamin D, diet and exercise    F/U  Return in about 1 year (around 09/13/2021).  Harmani Neto B. Aluna Whiston, PA-C 09/13/2020 12:20 PM

## 2020-09-13 ENCOUNTER — Encounter: Payer: Self-pay | Admitting: Obstetrics and Gynecology

## 2020-09-13 ENCOUNTER — Other Ambulatory Visit: Payer: Self-pay

## 2020-09-13 ENCOUNTER — Other Ambulatory Visit (HOSPITAL_COMMUNITY)
Admission: RE | Admit: 2020-09-13 | Discharge: 2020-09-13 | Disposition: A | Discharge status: Home / Self Care | Payer: 59 | Source: Ambulatory Visit | Attending: Obstetrics and Gynecology | Admitting: Obstetrics and Gynecology

## 2020-09-13 ENCOUNTER — Ambulatory Visit (INDEPENDENT_AMBULATORY_CARE_PROVIDER_SITE_OTHER): Payer: 59 | Admitting: Obstetrics and Gynecology

## 2020-09-13 VITALS — BP 100/70 | Ht 61.0 in | Wt 170.0 lb

## 2020-09-13 DIAGNOSIS — N898 Other specified noninflammatory disorders of vagina: Secondary | ICD-10-CM | POA: Diagnosis not present

## 2020-09-13 DIAGNOSIS — Z1151 Encounter for screening for human papillomavirus (HPV): Secondary | ICD-10-CM

## 2020-09-13 DIAGNOSIS — A630 Anogenital (venereal) warts: Secondary | ICD-10-CM

## 2020-09-13 DIAGNOSIS — Z124 Encounter for screening for malignant neoplasm of cervix: Secondary | ICD-10-CM | POA: Diagnosis not present

## 2020-09-13 DIAGNOSIS — Z01419 Encounter for gynecological examination (general) (routine) without abnormal findings: Secondary | ICD-10-CM | POA: Diagnosis not present

## 2020-09-13 DIAGNOSIS — R87610 Atypical squamous cells of undetermined significance on cytologic smear of cervix (ASC-US): Secondary | ICD-10-CM | POA: Insufficient documentation

## 2020-09-13 DIAGNOSIS — R635 Abnormal weight gain: Secondary | ICD-10-CM

## 2020-09-13 DIAGNOSIS — Z1231 Encounter for screening mammogram for malignant neoplasm of breast: Secondary | ICD-10-CM

## 2020-09-13 DIAGNOSIS — Z3041 Encounter for surveillance of contraceptive pills: Secondary | ICD-10-CM

## 2020-09-13 LAB — POCT WET PREP WITH KOH
Clue Cells Wet Prep HPF POC: NEGATIVE
KOH Prep POC: NEGATIVE
Trichomonas, UA: NEGATIVE
Yeast Wet Prep HPF POC: NEGATIVE

## 2020-09-13 MED ORDER — LEVONORGESTREL-ETHINYL ESTRAD 0.15-30 MG-MCG PO TABS: 1.0000 | ORAL_TABLET | Freq: Every day | ORAL | 3 refills | Status: DC

## 2020-09-13 NOTE — Patient Instructions (Signed)
I value your feedback and you entrusting us with your care. If you get a [facility] patient survey, I would appreciate you taking the time to let us know about your experience today. Thank you!  [facility]: [phone]      

## 2020-09-17 LAB — CYTOLOGY - PAP
Adequacy: ABSENT
Comment: NEGATIVE
Diagnosis: NEGATIVE
High risk HPV: NEGATIVE

## 2020-12-17 ENCOUNTER — Ambulatory Visit: Payer: 59 | Admitting: Obstetrics and Gynecology

## 2021-01-10 ENCOUNTER — Telehealth: Payer: Self-pay

## 2021-01-10 NOTE — Telephone Encounter (Signed)
Pt calling; wants a call back from [place].  [phone]  Left msg for pt to call c more information so ABC will know what she is calling her for.

## 2021-01-11 NOTE — Telephone Encounter (Signed)
Pt returned call this am; LMP was 9/29 and lasted 4-5 days; very heavy; started bleeding again yesterday, heavy. Doesn't know if ABC needs to change her pills or what.  States she took her pill off and on and has been off of them for 32m; changes pad every hour or so; doesn't want to go to ED.  Adv pt this is probably happening because she has been off the pill for 42m and now her body is having to readjust to bc; it takes a good three months for body to adjust.  To take pill at the same time every day; be patient and see if things straighten out on their own.  Pt aware to go to ED if saturates pad q10min-1hr.

## 2021-01-12 IMAGING — MG DIGITAL SCREENING BILATERAL MAMMOGRAM WITH TOMO AND CAD
8 series · 8 of 24 positions shown · non-contrast
Comparison: Previous exam(s).

CLINICAL DATA: Screening.

EXAM:
DIGITAL SCREENING BILATERAL MAMMOGRAM WITH TOMO AND CAD

[R CC synth-2D]
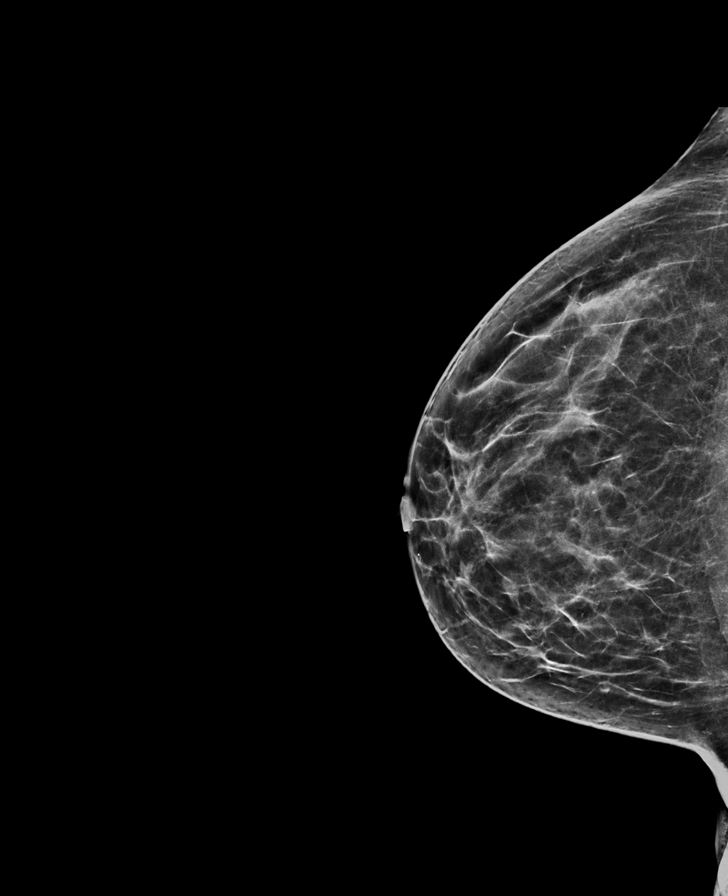

[R MLO synth-2D]
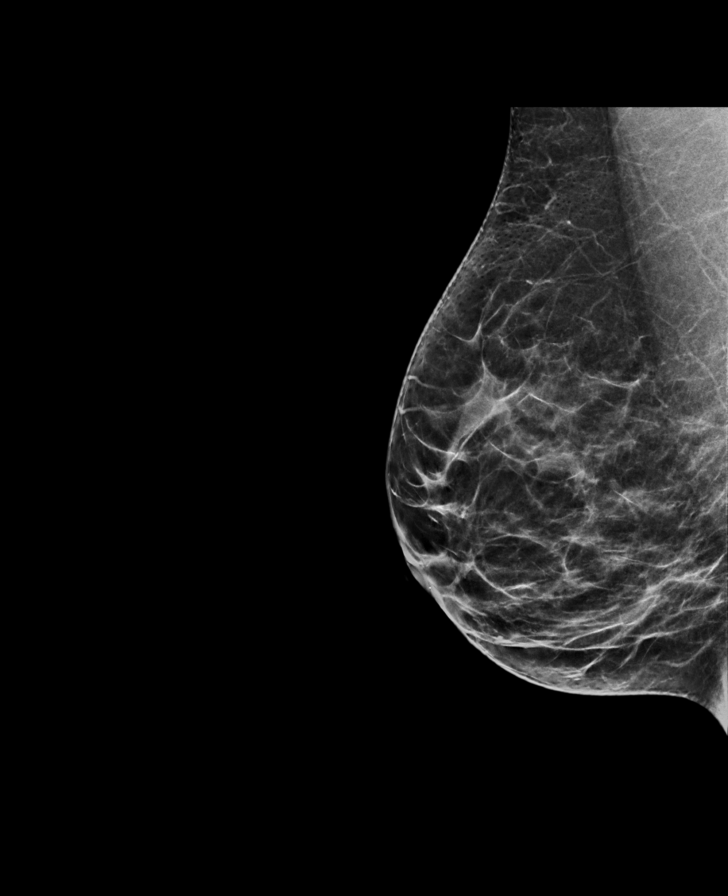

[L CC synth-2D]
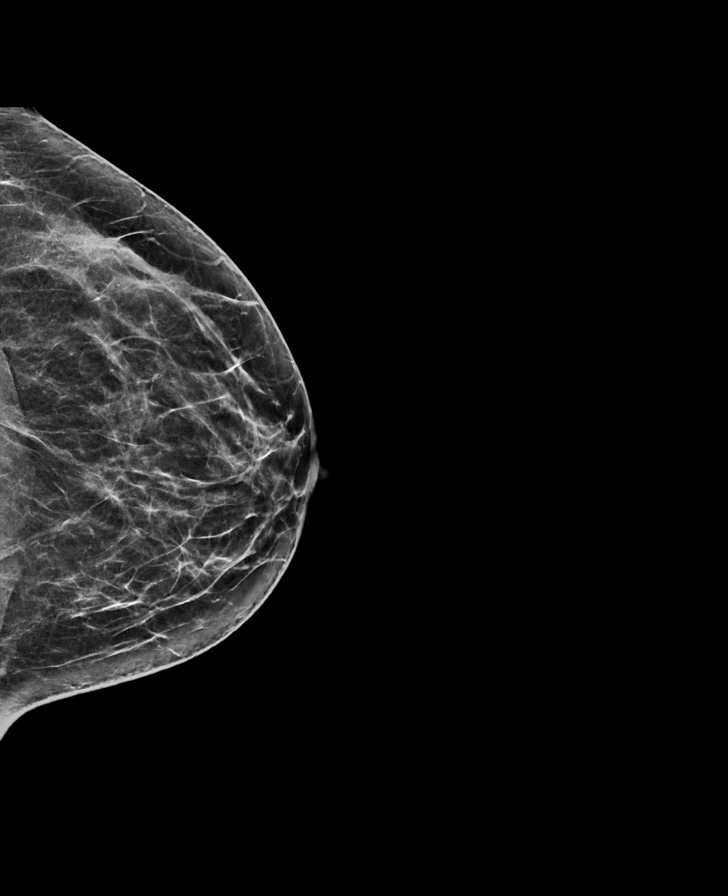

[L MLO synth-2D]
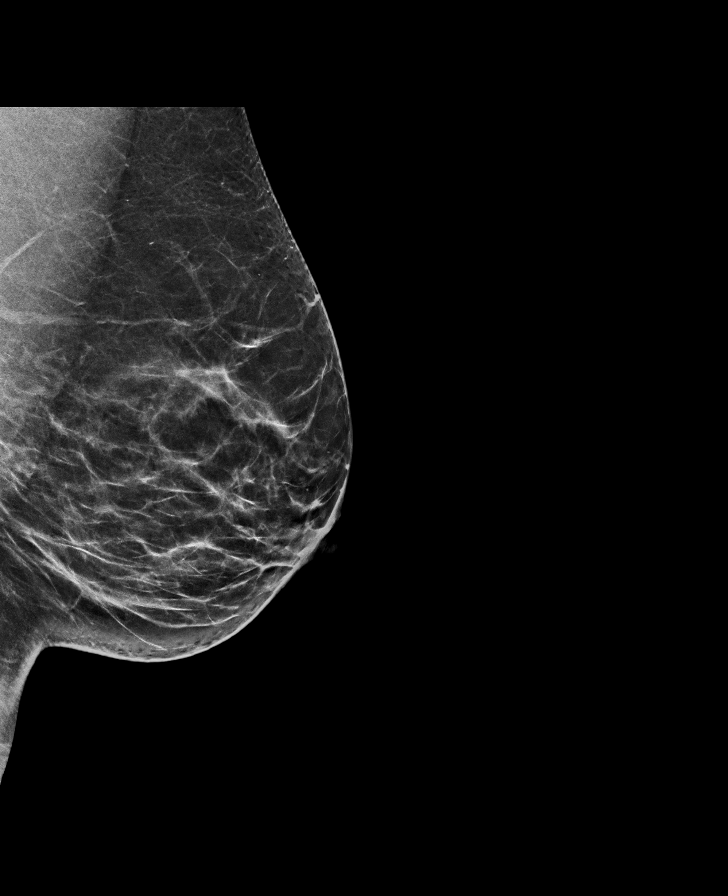

[R MLO tomo · tomo slice 33/64.0]
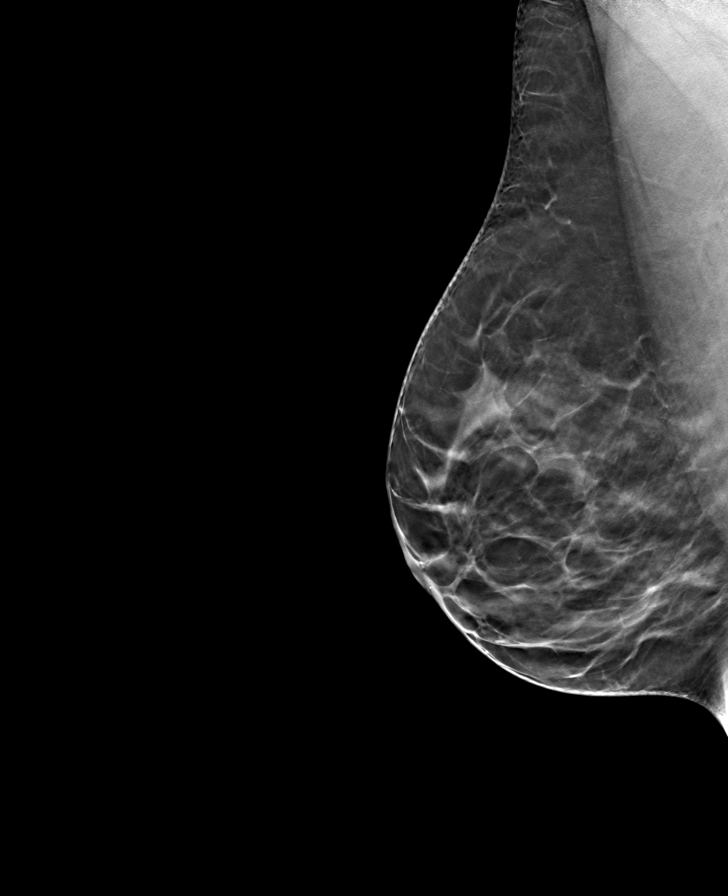

[L MLO tomo · tomo slice 35/68.0]
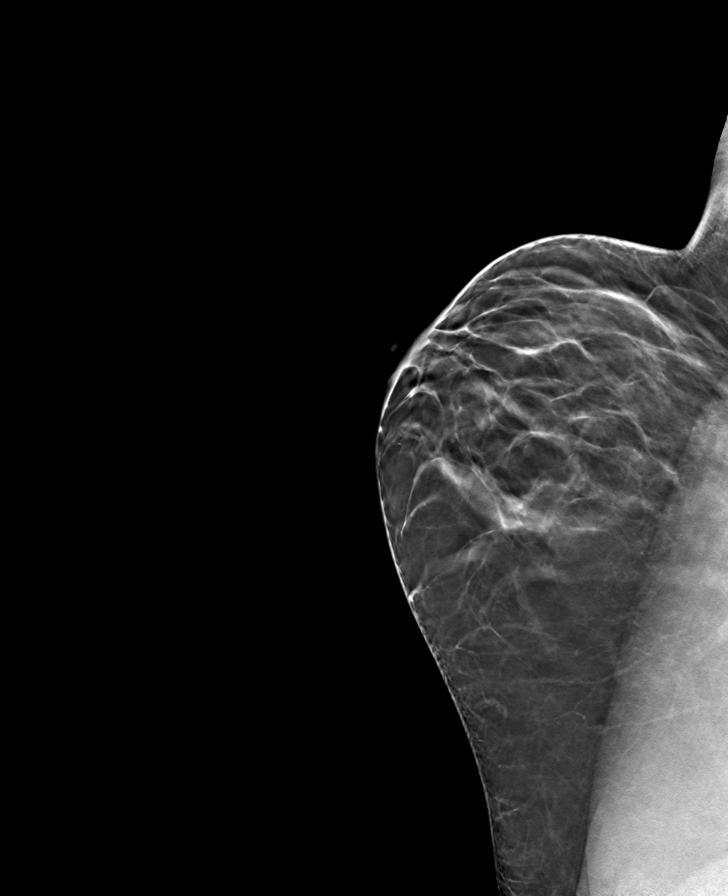

[R CC tomo · tomo slice 33/64.0]
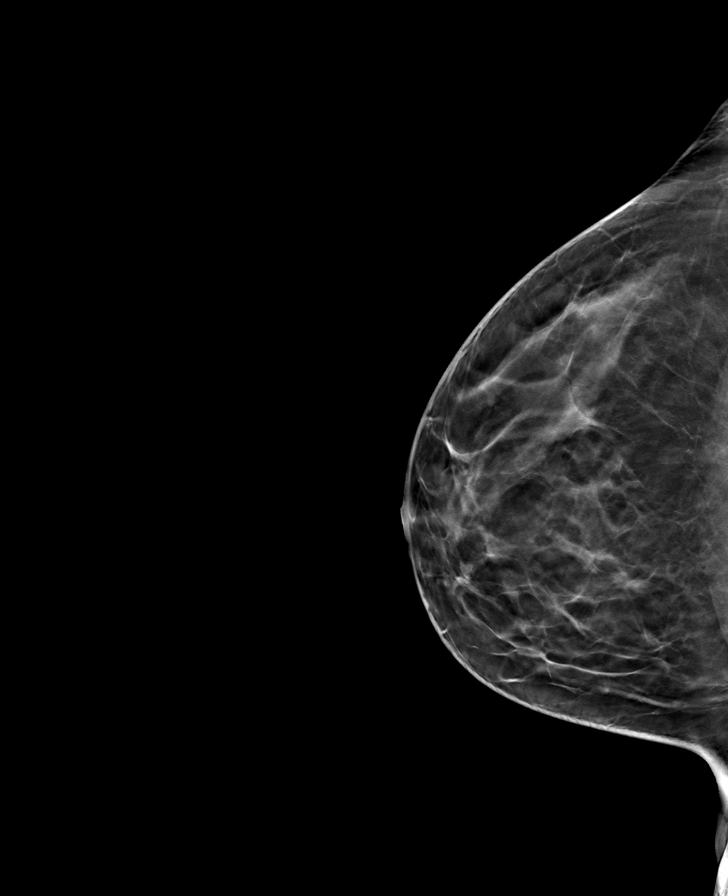

[L CC tomo · tomo slice 33/65.0]
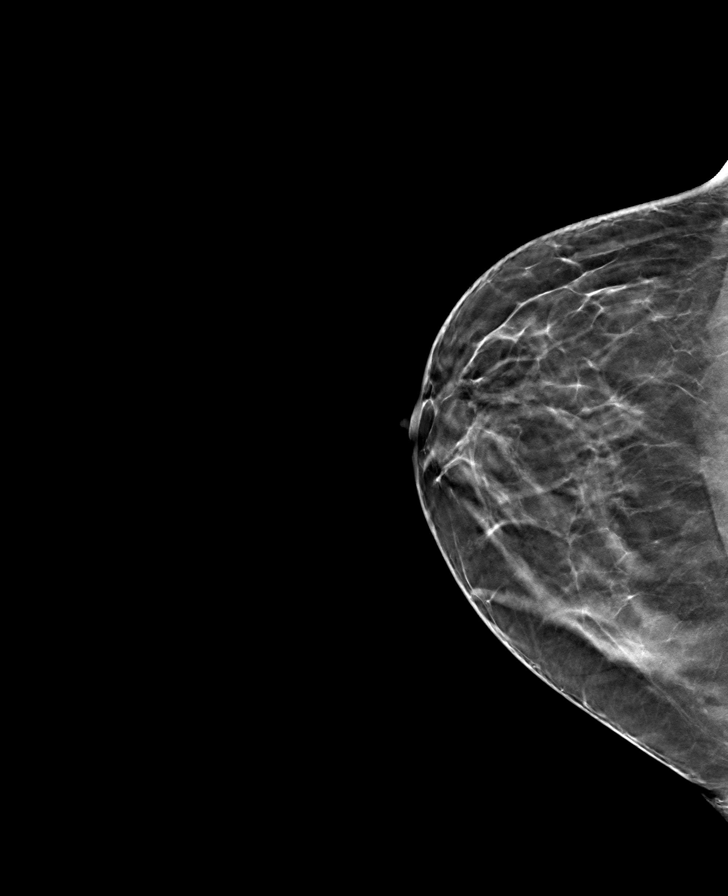

[8 of 24 positions shown; findings below may reference images not displayed]

ACR Breast Density Category b: There are scattered areas of
fibroglandular density.
FINDINGS: There are no findings suspicious for malignancy. Images were
processed with CAD.
IMPRESSION: No mammographic evidence of malignancy. A result letter of this
screening mammogram will be mailed directly to the patient.

RECOMMENDATION:
Screening mammogram in one year. (Code:[id number]-[id number])

BI-RADS CATEGORY  1: Negative.

Please Insert Correct Screening Template

nnn

## 2021-01-29 ENCOUNTER — Other Ambulatory Visit: Payer: Self-pay

## 2021-01-29 ENCOUNTER — Ambulatory Visit (INDEPENDENT_AMBULATORY_CARE_PROVIDER_SITE_OTHER): Payer: 59 | Admitting: Obstetrics and Gynecology

## 2021-01-29 ENCOUNTER — Encounter: Payer: Self-pay | Admitting: Obstetrics and Gynecology

## 2021-01-29 VITALS — BP 110/70 | Ht 61.0 in | Wt 173.0 lb

## 2021-01-29 DIAGNOSIS — N939 Abnormal uterine and vaginal bleeding, unspecified: Secondary | ICD-10-CM | POA: Diagnosis not present

## 2021-01-29 LAB — POCT URINE PREGNANCY: Preg Test, Ur: NEGATIVE

## 2021-01-29 NOTE — Progress Notes (Signed)
Pcp, No   Chief Complaint  Patient presents with   Vaginal Bleeding    Bleeding every week since the end Sept, heavy flow at times, pelvic pain entire area and lower back pain    HPI:      Wanda Lloyd is a 50 y.o. [id number] whose LMP was No LMP recorded. (Menstrual status: Irregular Periods)., presents today for AUB since 9/22. Has been on portia OCPs for a long time with monthly menses, lasting 5 days.  Dysmenorrhea mild, occurring first 1-2 days of flow. She does not have intermenstrual bleeding usually. Thinks she had some BTB 8/22 however, so stopped OCPs for a month. Then restarted them about 6 wks ago and has had almost daily bleeding or spotting since. She is changing products every 1-2 hrs on heavy days and then sometimes has no bleeding till the next day. No late/missed pills. Has pelvic cramping and LBP daily with bleeding, no sx if not bleeding. Feels like she is going to "start her period" every day with cramping. Takes NSAIDs when needed with sx relief. She is sex active, no new partners. No pain with sex but it causes bleeding to restart. Neg TSH 7/22 with PCP.  Neg pap/neg HPV DNA 6/22; neg GYN u/s 3/21, no leio on 3/21 u/s report but documented on 2014 GYN u/s as "3.3 x 2.5 cm leio, possibly encroaching on EM"  Past Medical History:  Diagnosis Date   Adenomatous colon polyp 2014   removed on colonoscopy   Gastritis    History of abnormal cervical Pap smear 2015   Knee pain    Uterine fibroid     Past Surgical History:  Procedure Laterality Date   CESAREAN SECTION  1998/2003   COLONOSCOPY  11/02/2012   adenomatous polyp and internal hemmorhoid   ESOPHAGOGASTRODUODENOSCOPY  2014   gastritis   TONSILLECTOMY      Family History  Problem Relation Age of Onset   AAA (abdominal aortic aneurysm) Father    Diabetes Father    Colon cancer Maternal Grandmother 33   Congestive Heart Failure Maternal Grandmother    Heart attack Maternal Grandfather    Throat cancer  Maternal Uncle    Pancreatic cancer Cousin 24    Social History   Socioeconomic History   Marital status: Divorced    Spouse name: Not on file   Number of children: 3   Years of education: Not on file   Highest education level: Not on file  Occupational History   Occupation: self employed  Tobacco Use   Smoking status: Never   Smokeless tobacco: Never  Vaping Use   Vaping Use: Never used  Substance and Sexual Activity   Alcohol use: No   Drug use: No   Sexual activity: Yes    Partners: Male    Birth control/protection: Pill  Other Topics Concern   Not on file  Social History Narrative   Not on file   Social Determinants of Health   Financial Resource Strain: Not on file  Food Insecurity: Not on file  Transportation Needs: Not on file  Physical Activity: Not on file  Stress: Not on file  Social Connections: Not on file  Intimate Partner Violence: Not on file    Outpatient Medications Prior to Visit  Medication Sig Dispense Refill   clonazePAM (KLONOPIN) 0.5 MG tablet Take by mouth.     imiquimod (ALDARA) 5 % cream Apply topically 3 (three) times a week. Apply until total clearance  or maximum of 16 weeks 24 each 3   levonorgestrel-ethinyl estradiol (PORTIA-28) 0.15-30 MG-MCG tablet Take 1 tablet by mouth daily. 84 tablet 3   No facility-administered medications prior to visit.      ROS:  Review of Systems  Constitutional:  Negative for fever.  Gastrointestinal:  Negative for blood in stool, constipation, diarrhea, nausea and vomiting.  Genitourinary:  Positive for menstrual problem and pelvic pain. Negative for dyspareunia, dysuria, flank pain, frequency, hematuria, urgency, vaginal bleeding, vaginal discharge and vaginal pain.  Musculoskeletal:  Positive for back pain.  Skin:  Negative for rash.  BREAST: No symptoms   OBJECTIVE:   Vitals:  BP 110/70   Ht 5\' 1"  (1.549 m)   Wt 173 lb (78.5 kg)   BMI 32.69 kg/m   Physical Exam Vitals reviewed.   Constitutional:      Appearance: She is well-developed.  Pulmonary:     Effort: Pulmonary effort is normal.  Genitourinary:    General: Normal vulva.     Pubic Area: No rash.      Labia:        Right: No rash, tenderness or lesion.        Left: No rash, tenderness or lesion.      Vagina: Bleeding present. No vaginal discharge, erythema or tenderness.     Cervix: Normal.     Uterus: Normal. Not enlarged and not tender.      Adnexa: Right adnexa normal and left adnexa normal.       Right: No mass or tenderness.         Left: No mass or tenderness.    Musculoskeletal:        General: Normal range of motion.     Cervical back: Normal range of motion.  Skin:    General: Skin is warm and dry.  Neurological:     General: No focal deficit present.     Mental Status: She is alert and oriented to person, place, and time.  Psychiatric:        Mood and Affect: Mood normal.        Behavior: Behavior normal.        Thought Content: Thought content normal.        Judgment: Judgment normal.    Results: Results for orders placed or performed in visit on 01/29/21 (from the past 24 hour(s))  POCT urine pregnancy     Status: Normal   Collection Time: 01/29/21  3:54 PM  Result Value Ref Range   Preg Test, Ur Negative Negative     Assessment/Plan: Abnormal uterine bleeding (AUB) - Plan: US PELVIC COMPLETE WITH TRANSVAGINAL, POCT urine pregnancy; for 6 wks on OCPs. Neg UPT, neg TSH 7/22 with PCP. Check GYN u/s, will f/u with results. Do 2 OCPs daily for 5 days to see if stops bleeding, then 1 pill daily, then placebo pills as directed. If Gyn u/s neg, will change OCPs.     Return if symptoms worsen or fail to improve.  Rylah Fukuda B. Krystan Northrop, PA-C 01/29/2021 3:57 PM

## 2021-02-05 ENCOUNTER — Ambulatory Visit
Admission: RE | Admit: 2021-02-05 | Discharge: 2021-02-05 | Disposition: A | Discharge status: Home / Self Care | Payer: 59 | Source: Ambulatory Visit | Attending: Obstetrics and Gynecology | Admitting: Obstetrics and Gynecology

## 2021-02-05 DIAGNOSIS — N939 Abnormal uterine and vaginal bleeding, unspecified: Secondary | ICD-10-CM | POA: Diagnosis not present

## 2021-02-08 ENCOUNTER — Telehealth: Payer: Self-pay

## 2021-02-08 NOTE — Telephone Encounter (Signed)
Pt calling to verify Fortunato Curling is the same as portia.  [phone]  Adv pt they are the same; pt states she has take 2 pills a day for 5d and is now taking one pill a day and is still bleeding; it's heavier today than the whole week; will continue taking.  Adv will let ABC know.

## 2021-02-11 NOTE — Telephone Encounter (Signed)
Yes she is close to the placebo pills and confirmed bleeding has slowed down.

## 2021-02-11 NOTE — Telephone Encounter (Signed)
Pls check with pt [id number] today. She should be close to placebo pills.Thx

## 2021-02-11 NOTE — Telephone Encounter (Signed)
Called pt, no answer, LVMTRC. 

## 2021-07-07 ENCOUNTER — Other Ambulatory Visit: Payer: Self-pay | Admitting: Obstetrics and Gynecology

## 2021-07-07 DIAGNOSIS — Z3041 Encounter for surveillance of contraceptive pills: Secondary | ICD-10-CM

## 2021-08-13 ENCOUNTER — Other Ambulatory Visit: Payer: Self-pay | Admitting: Obstetrics and Gynecology

## 2021-08-13 DIAGNOSIS — Z3041 Encounter for surveillance of contraceptive pills: Secondary | ICD-10-CM

## 2021-08-20 ENCOUNTER — Other Ambulatory Visit: Payer: Self-pay | Admitting: Obstetrics and Gynecology

## 2021-08-20 DIAGNOSIS — Z3041 Encounter for surveillance of contraceptive pills: Secondary | ICD-10-CM

## 2021-08-22 ENCOUNTER — Telehealth: Payer: Self-pay

## 2021-08-22 ENCOUNTER — Other Ambulatory Visit: Payer: Self-pay | Admitting: Obstetrics and Gynecology

## 2021-08-22 DIAGNOSIS — Z3041 Encounter for surveillance of contraceptive pills: Secondary | ICD-10-CM

## 2021-08-22 NOTE — Telephone Encounter (Signed)
Spoke to Wanda Lloyd and told her she was due for an annual and once she got that annual scheduled we could call in a refill.

## 2021-08-22 NOTE — Telephone Encounter (Signed)
Toi called in needing a refill on her birth control

## 2021-08-23 ENCOUNTER — Telehealth: Payer: Self-pay

## 2021-08-23 DIAGNOSIS — Z3041 Encounter for surveillance of contraceptive pills: Secondary | ICD-10-CM

## 2021-08-23 MED ORDER — LEVONORGESTREL-ETHINYL ESTRAD 0.15-30 MG-MCG PO TABS: 1.0000 | ORAL_TABLET | Freq: Every day | ORAL | 0 refills | Status: DC

## 2021-08-23 NOTE — Telephone Encounter (Signed)
[facility] RF sent, pt aware.

## 2021-09-26 ENCOUNTER — Other Ambulatory Visit: Payer: Self-pay | Admitting: Obstetrics and Gynecology

## 2021-09-26 DIAGNOSIS — Z1231 Encounter for screening mammogram for malignant neoplasm of breast: Secondary | ICD-10-CM

## 2021-10-08 ENCOUNTER — Other Ambulatory Visit: Payer: Self-pay | Admitting: Obstetrics and Gynecology

## 2021-10-08 DIAGNOSIS — Z3041 Encounter for surveillance of contraceptive pills: Secondary | ICD-10-CM

## 2021-10-14 DIAGNOSIS — J329 Chronic sinusitis, unspecified: Secondary | ICD-10-CM | POA: Diagnosis not present

## 2021-10-14 DIAGNOSIS — R42 Dizziness and giddiness: Secondary | ICD-10-CM | POA: Diagnosis not present

## 2021-10-14 DIAGNOSIS — H6982 Other specified disorders of Eustachian tube, left ear: Secondary | ICD-10-CM | POA: Diagnosis not present

## 2021-10-14 DIAGNOSIS — H9192 Unspecified hearing loss, left ear: Secondary | ICD-10-CM | POA: Diagnosis not present

## 2021-10-14 DIAGNOSIS — R208 Other disturbances of skin sensation: Secondary | ICD-10-CM | POA: Diagnosis not present

## 2021-10-16 DIAGNOSIS — H9042 Sensorineural hearing loss, unilateral, left ear, with unrestricted hearing on the contralateral side: Secondary | ICD-10-CM | POA: Diagnosis not present

## 2021-10-17 ENCOUNTER — Ambulatory Visit
Admission: RE | Admit: 2021-10-17 | Discharge: 2021-10-17 | Disposition: A | Discharge status: Home / Self Care | Payer: 59 | Source: Ambulatory Visit | Attending: Obstetrics and Gynecology | Admitting: Obstetrics and Gynecology

## 2021-10-17 DIAGNOSIS — Z1231 Encounter for screening mammogram for malignant neoplasm of breast: Secondary | ICD-10-CM | POA: Diagnosis not present

## 2021-10-22 ENCOUNTER — Other Ambulatory Visit: Payer: Self-pay | Admitting: Student

## 2021-10-22 DIAGNOSIS — H9122 Sudden idiopathic hearing loss, left ear: Secondary | ICD-10-CM

## 2021-10-28 DIAGNOSIS — H9122 Sudden idiopathic hearing loss, left ear: Secondary | ICD-10-CM | POA: Diagnosis not present

## 2021-10-29 ENCOUNTER — Ambulatory Visit
Admission: RE | Admit: 2021-10-29 | Discharge: 2021-10-29 | Disposition: A | Discharge status: Home / Self Care | Payer: 59 | Source: Ambulatory Visit | Attending: Student | Admitting: Student

## 2021-10-29 DIAGNOSIS — H9122 Sudden idiopathic hearing loss, left ear: Secondary | ICD-10-CM

## 2021-10-29 MED ORDER — GADOBENATE DIMEGLUMINE 529 MG/ML IV SOLN
15.0000 mL | Freq: Once | INTRAVENOUS | Status: AC | PRN
  Administered 2021-10-29: 15 mL via INTRAVENOUS

## 2021-10-31 ENCOUNTER — Other Ambulatory Visit: Payer: 59

## 2021-11-19 DIAGNOSIS — R9089 Other abnormal findings on diagnostic imaging of central nervous system: Secondary | ICD-10-CM | POA: Diagnosis not present

## 2021-11-19 DIAGNOSIS — R42 Dizziness and giddiness: Secondary | ICD-10-CM | POA: Diagnosis not present

## 2021-11-19 DIAGNOSIS — H9122 Sudden idiopathic hearing loss, left ear: Secondary | ICD-10-CM | POA: Diagnosis not present

## 2021-11-22 DIAGNOSIS — R42 Dizziness and giddiness: Secondary | ICD-10-CM | POA: Diagnosis not present

## 2021-11-22 DIAGNOSIS — R9089 Other abnormal findings on diagnostic imaging of central nervous system: Secondary | ICD-10-CM | POA: Diagnosis not present

## 2021-11-22 DIAGNOSIS — H9192 Unspecified hearing loss, left ear: Secondary | ICD-10-CM | POA: Diagnosis not present

## 2021-11-26 ENCOUNTER — Other Ambulatory Visit: Payer: Self-pay | Admitting: Neurology

## 2021-11-26 DIAGNOSIS — R9089 Other abnormal findings on diagnostic imaging of central nervous system: Secondary | ICD-10-CM

## 2021-12-24 ENCOUNTER — Ambulatory Visit: Payer: Self-pay | Admitting: Obstetrics and Gynecology

## 2021-12-31 ENCOUNTER — Ambulatory Visit: Payer: 59 | Admitting: Obstetrics and Gynecology

## 2022-01-06 ENCOUNTER — Other Ambulatory Visit: Payer: Self-pay | Admitting: Obstetrics and Gynecology

## 2022-01-06 DIAGNOSIS — Z3041 Encounter for surveillance of contraceptive pills: Secondary | ICD-10-CM

## 2022-01-17 DIAGNOSIS — R69 Illness, unspecified: Secondary | ICD-10-CM | POA: Diagnosis not present

## 2022-01-30 ENCOUNTER — Ambulatory Visit: Payer: 59 | Admitting: Obstetrics and Gynecology

## 2022-01-30 ENCOUNTER — Other Ambulatory Visit: Payer: Self-pay | Admitting: Obstetrics and Gynecology

## 2022-01-30 DIAGNOSIS — Z3041 Encounter for surveillance of contraceptive pills: Secondary | ICD-10-CM

## 2022-02-20 DIAGNOSIS — R9089 Other abnormal findings on diagnostic imaging of central nervous system: Secondary | ICD-10-CM | POA: Diagnosis not present

## 2022-02-20 DIAGNOSIS — R42 Dizziness and giddiness: Secondary | ICD-10-CM | POA: Diagnosis not present

## 2022-02-24 NOTE — Progress Notes (Unsigned)
PCP: Sofie Hartigan, MD   No chief complaint on file.   HPI:      [name] is a 51 y.o. [phone] whose LMP was No LMP recorded. (Menstrual status: Irregular Periods)., presents today for her annual examination.  Her menses are regular every 28-30 days, lasting 5 days.  Dysmenorrhea mild, occurring first 1-2 days of flow. She does not have intermenstrual bleeding.  Sex activity: single partner, contraception - OCP (estrogen/progesterone).  Last Pap: 09/13/20  Results were: no abnormalities/neg HPV DNA Hx of STDs: ext HPV; lesions treated with podophyllin without relief 2021. No TCA available so treated with aldara 1/22. Pt noticed improvement if she uses as directed but often forgets to do tx.   Treated for BV 1/22 with temporary relief. Notices vaginal odor again.   Last mammogram: 10/17/21 Results were: normal--routine follow-up in 12 months There is no FH of breast cancer. There is no FH of ovarian cancer. The patient does do self-breast exams.  Colonoscopy: 2022 at [facility];  Repeat due after 7 years. FH colon cancer in her MGM and pancreatic cancer in her mat cousin (deceased). I recommended genetic testing for pt's mat side since she doesn't qualify.   Tobacco use: The patient denies current or previous tobacco use. Alcohol use: social drinker No drug use. Exercise: moderately active  She does not get adequate calcium and Vitamin D in her diet.  Labs with PCP. Pt has been unable to lose wt. Is doing 1200-1400 cal daily and lower carb without change. Exercising 5-6 times weekly. Just creeping up. Had normal TSH last yr.   Past Medical History:  Diagnosis Date   Adenomatous colon polyp 2014   removed on colonoscopy   Gastritis    History of abnormal cervical Pap smear 2015   Knee pain    Uterine fibroid     Past Surgical History:  Procedure Laterality Date   CESAREAN SECTION  1998/2003   COLONOSCOPY  11/02/2012   adenomatous polyp and internal hemmorhoid    ESOPHAGOGASTRODUODENOSCOPY  2014   gastritis   TONSILLECTOMY      Family History  Problem Relation Age of Onset   AAA (abdominal aortic aneurysm) Father    Diabetes Father    Throat cancer Maternal Uncle    AAA (abdominal aortic aneurysm) Paternal Aunt    Colon cancer Maternal Grandmother 80   Congestive Heart Failure Maternal Grandmother    Heart attack Maternal Grandfather    Pancreatic cancer Cousin 6    Social History   Socioeconomic History   Marital status: Divorced    Spouse name: Not on file   Number of children: 3   Years of education: Not on file   Highest education level: Not on file  Occupational History   Occupation: self employed  Tobacco Use   Smoking status: Never   Smokeless tobacco: Never  Vaping Use   Vaping Use: Never used  Substance and Sexual Activity   Alcohol use: No   Drug use: No   Sexual activity: Yes    Partners: Male    Birth control/protection: Pill  Other Topics Concern   Not on file  Social History Narrative   Not on file   Social Determinants of Health   Financial Resource Strain: Not on file  Food Insecurity: Not on file  Transportation Needs: Not on file  Physical Activity: Not on file  Stress: Not on file  Social Connections: Not on file  Intimate Partner Violence: Not on  file     Current Outpatient Medications:    clonazePAM (KLONOPIN) 0.5 MG tablet, Take by mouth., Disp: , Rfl:    imiquimod (ALDARA) 5 % cream, Apply topically 3 (three) times a week. Apply until total clearance or maximum of 16 weeks, Disp: 24 each, Rfl: 3   levonorgestrel-ethinyl estradiol (ALTAVERA) 0.15-30 MG-MCG tablet, TAKE 1 TABLET BY MOUTH EVERY DAY, Disp: 84 tablet, Rfl: 0     ROS:  Review of Systems  Constitutional:  Negative for fatigue, fever and unexpected weight change.  Respiratory:  Negative for cough, shortness of breath and wheezing.   Cardiovascular:  Negative for chest pain, palpitations and leg swelling.  Gastrointestinal:   Negative for blood in stool, constipation, diarrhea, nausea and vomiting.  Endocrine: Negative for cold intolerance, heat intolerance and polyuria.  Genitourinary:  Negative for dyspareunia, dysuria, flank pain, frequency, genital sores, hematuria, menstrual problem, pelvic pain, urgency, vaginal bleeding, vaginal discharge and vaginal pain.  Musculoskeletal:  Negative for back pain, joint swelling and myalgias.  Skin:  Negative for rash.  Neurological:  Negative for dizziness, syncope, light-headedness, numbness and headaches.  Hematological:  Negative for adenopathy.  Psychiatric/Behavioral:  Negative for agitation, confusion, sleep disturbance and suicidal ideas. The patient is not nervous/anxious.    BREAST: No symptoms    Objective: There were no vitals taken for this visit.   Physical Exam Constitutional:      Appearance: She is well-developed.  Genitourinary:     Vulva normal.     Right Labia: lesions.     Right Labia: No rash or tenderness.    Left Labia: lesions.     Left Labia: No tenderness or rash.    No vaginal discharge, erythema or tenderness.      Right Adnexa: not tender and no mass present.    Left Adnexa: not tender and no mass present.    No cervical friability or polyp.     Uterus is not enlarged or tender.  Breasts:    Right: No mass, nipple discharge, skin change or tenderness.     Left: No mass, nipple discharge, skin change or tenderness.  Neck:     Thyroid: No thyromegaly.  Cardiovascular:     Rate and Rhythm: Normal rate and regular rhythm.     Heart sounds: Normal heart sounds. No murmur heard. Pulmonary:     Effort: Pulmonary effort is normal.     Breath sounds: Normal breath sounds.  Abdominal:     Palpations: Abdomen is soft.     Tenderness: There is no abdominal tenderness. There is no guarding or rebound.  Musculoskeletal:        General: Normal range of motion.     Cervical back: Normal range of motion.  Lymphadenopathy:      Cervical: No cervical adenopathy.  Neurological:     General: No focal deficit present.     Mental Status: She is alert and oriented to person, place, and time.     Cranial Nerves: No cranial nerve deficit.  Skin:    General: Skin is warm and dry.  Psychiatric:        Mood and Affect: Mood normal.        Behavior: Behavior normal.        Thought Content: Thought content normal.        Judgment: Judgment normal.  Vitals reviewed.     Results: No results found for this or any previous visit (from the past 24 hour(s)).   Assessment/Plan:  Encounter for annual routine gynecological examination  Cervical cancer screening - Plan: Cytology - PAP  Screening for HPV (human papillomavirus) - Plan: Cytology - PAP  ASCUS of cervix with negative high risk HPV - Plan: Cytology - PAP; repeat today. Will f/u if abn.  Encounter for surveillance of contraceptive pills - Plan: levonorgestrel-ethinyl estradiol (PORTIA-28) 0.15-30 MG-MCG tablet; OCP RF  Encounter for screening mammogram for malignant neoplasm of breast - Plan: MM 3D SCREEN BREAST BILATERAL; pt to sched mammo  Weight gain - Discussed low carb diet changes to see if helps. F/u prn. Declines updated TSH screening since normal last yr (and that is appropriate).   Vaginal odor - Plan: POCT Wet Prep with KOH; neg wet prep. Try repHresh. F/u prn. Will RF flagyl prn.   Genital warts--2 treated with histofreeze today, wound care discussed. Pt has plans this wknd and we don't want treated areas to be problematic. Will see if the lesions resolve. If so, pt will RTO for further tx. If not, will RF aldara crm. F/u prn.    No orders of the defined types were placed in this encounter.           GYN counsel mammography screening, adequate intake of calcium and vitamin D, diet and exercise    F/U  No follow-ups on file.  Jaeshawn Silvio B. Aislee Landgren, PA-C 02/24/2022 3:15 PM

## 2022-02-25 ENCOUNTER — Encounter: Payer: Self-pay | Admitting: Obstetrics and Gynecology

## 2022-02-25 ENCOUNTER — Ambulatory Visit (INDEPENDENT_AMBULATORY_CARE_PROVIDER_SITE_OTHER): Payer: 59 | Admitting: Obstetrics and Gynecology

## 2022-02-25 VITALS — BP 100/70 | Ht 61.0 in | Wt 179.0 lb

## 2022-02-25 DIAGNOSIS — Z1231 Encounter for screening mammogram for malignant neoplasm of breast: Secondary | ICD-10-CM

## 2022-02-25 DIAGNOSIS — Z3041 Encounter for surveillance of contraceptive pills: Secondary | ICD-10-CM

## 2022-02-25 DIAGNOSIS — A630 Anogenital (venereal) warts: Secondary | ICD-10-CM

## 2022-02-25 DIAGNOSIS — Z01419 Encounter for gynecological examination (general) (routine) without abnormal findings: Secondary | ICD-10-CM

## 2022-02-25 DIAGNOSIS — Z01411 Encounter for gynecological examination (general) (routine) with abnormal findings: Secondary | ICD-10-CM | POA: Diagnosis not present

## 2022-02-25 MED ORDER — LEVONORGESTREL-ETHINYL ESTRAD 0.15-30 MG-MCG PO TABS: 1.0000 | ORAL_TABLET | Freq: Every day | ORAL | 3 refills | Status: DC

## 2022-03-08 DIAGNOSIS — R300 Dysuria: Secondary | ICD-10-CM | POA: Diagnosis not present

## 2022-03-21 DIAGNOSIS — N390 Urinary tract infection, site not specified: Secondary | ICD-10-CM | POA: Diagnosis not present

## 2022-03-21 DIAGNOSIS — F419 Anxiety disorder, unspecified: Secondary | ICD-10-CM | POA: Diagnosis not present

## 2022-06-04 DIAGNOSIS — F4312 Post-traumatic stress disorder, chronic: Secondary | ICD-10-CM | POA: Diagnosis not present

## 2022-07-17 DIAGNOSIS — F419 Anxiety disorder, unspecified: Secondary | ICD-10-CM | POA: Diagnosis not present

## 2022-07-17 DIAGNOSIS — Z0000 Encounter for general adult medical examination without abnormal findings: Secondary | ICD-10-CM | POA: Diagnosis not present

## 2022-07-18 DIAGNOSIS — Z0000 Encounter for general adult medical examination without abnormal findings: Secondary | ICD-10-CM | POA: Diagnosis not present

## 2022-10-21 ENCOUNTER — Ambulatory Visit
Admission: RE | Admit: 2022-10-21 | Discharge: 2022-10-21 | Disposition: A | Discharge status: Home / Self Care | Payer: 59 | Source: Ambulatory Visit | Attending: Obstetrics and Gynecology | Admitting: Obstetrics and Gynecology

## 2022-10-21 DIAGNOSIS — Z1231 Encounter for screening mammogram for malignant neoplasm of breast: Secondary | ICD-10-CM | POA: Diagnosis not present

## 2023-01-13 DIAGNOSIS — L821 Other seborrheic keratosis: Secondary | ICD-10-CM | POA: Diagnosis not present

## 2023-01-13 DIAGNOSIS — D225 Melanocytic nevi of trunk: Secondary | ICD-10-CM | POA: Diagnosis not present

## 2023-01-13 DIAGNOSIS — L218 Other seborrheic dermatitis: Secondary | ICD-10-CM | POA: Diagnosis not present

## 2023-01-13 DIAGNOSIS — L408 Other psoriasis: Secondary | ICD-10-CM | POA: Diagnosis not present

## 2023-01-13 DIAGNOSIS — L814 Other melanin hyperpigmentation: Secondary | ICD-10-CM | POA: Diagnosis not present

## 2023-01-13 DIAGNOSIS — D485 Neoplasm of uncertain behavior of skin: Secondary | ICD-10-CM | POA: Diagnosis not present

## 2023-01-13 DIAGNOSIS — L728 Other follicular cysts of the skin and subcutaneous tissue: Secondary | ICD-10-CM | POA: Diagnosis not present

## 2023-01-19 DIAGNOSIS — R5383 Other fatigue: Secondary | ICD-10-CM | POA: Diagnosis not present

## 2023-01-19 DIAGNOSIS — R6882 Decreased libido: Secondary | ICD-10-CM | POA: Diagnosis not present

## 2023-01-19 DIAGNOSIS — Z6834 Body mass index (BMI) 34.0-34.9, adult: Secondary | ICD-10-CM | POA: Diagnosis not present

## 2023-01-19 DIAGNOSIS — N951 Menopausal and female climacteric states: Secondary | ICD-10-CM | POA: Diagnosis not present

## 2023-01-27 ENCOUNTER — Other Ambulatory Visit: Payer: Self-pay | Admitting: Obstetrics and Gynecology

## 2023-01-27 DIAGNOSIS — Z3041 Encounter for surveillance of contraceptive pills: Secondary | ICD-10-CM

## 2023-01-28 ENCOUNTER — Telehealth: Payer: Self-pay

## 2023-01-28 ENCOUNTER — Encounter: Payer: Self-pay | Admitting: Obstetrics and Gynecology

## 2023-01-28 NOTE — Telephone Encounter (Signed)
What symptoms is she having to start the other hormones? Is she having periods since stopping her OCPs? I manage HRT based on sx not lab values. Pt is most likely perimenopausal due to age but best to check hormone levels 3 months after stopping OCPs to get more accurate levels. I'll call her after you get this info. Thx.

## 2023-01-28 NOTE — Telephone Encounter (Signed)
Pt calling to get [facility]'s opinion before her appt in Dec c [facility].  Pt went to [facility]; they checked her estradiol level while still on bc which was 65 and it was 16.9 after being off bc for 8wks.She was given RX for progesterone 100mg  capsules one po at bedtime and testosterone gel to rub behind her knee at hs (doesn't know the str as she hasn't p/u from pharm yet).  Pt hasn't started either of these rxs b/c she wants to see what [facility] thinks first.  Pt aware [facility] is not in the office today but is in tomorrow and can be reached at [phone].

## 2023-01-29 NOTE — Telephone Encounter (Signed)
Spoke with pt. Stopped OCPs 11/23/22. Partner with vasectomy and had wanted to come off them anyway. Had a period end of 10/24, some spotting in between. Pt went to [facility] for wt gain/inability to lose wt. Pt doing 1600 cal daily, recording all food, measuring, counting macros, 90 g carbs daily. [facility] did labs on OCPs and then again a few wks after stopping OCPs. Pt given Rx for prog and testosterone. Pt wanted to discuss with me. Has had decreased libido and a few more night sweats since off OCPs. Discussed sx and what tyring to achieve with HRT. Pt not menopausal given she had period on and off OCPs. Discussed best to check hormone labs 3 months off hormones for accurate results. No harm in trying prog and testosterone and can f/u with sx 12/24 at annual.  Pt interested in trying GLP-1 with them for wt loss.

## 2023-01-29 NOTE — Telephone Encounter (Signed)
Not having any sx. She stopped OCP's on 11/23/22 and has had 1 cycle since stopping it. Lasted 3-4 days moderate flow, no pain. Aware you will call her sometime today.

## 2023-01-29 NOTE — Telephone Encounter (Signed)
Reviewed

## 2023-02-05 DIAGNOSIS — Z6833 Body mass index (BMI) 33.0-33.9, adult: Secondary | ICD-10-CM | POA: Diagnosis not present

## 2023-02-05 DIAGNOSIS — R5383 Other fatigue: Secondary | ICD-10-CM | POA: Diagnosis not present

## 2023-02-12 DIAGNOSIS — Z6833 Body mass index (BMI) 33.0-33.9, adult: Secondary | ICD-10-CM | POA: Diagnosis not present

## 2023-02-12 DIAGNOSIS — R5383 Other fatigue: Secondary | ICD-10-CM | POA: Diagnosis not present

## 2023-02-15 ENCOUNTER — Other Ambulatory Visit: Payer: Self-pay | Admitting: Obstetrics and Gynecology

## 2023-02-15 DIAGNOSIS — Z3041 Encounter for surveillance of contraceptive pills: Secondary | ICD-10-CM

## 2023-02-17 DIAGNOSIS — K582 Mixed irritable bowel syndrome: Secondary | ICD-10-CM | POA: Diagnosis not present

## 2023-02-17 DIAGNOSIS — K219 Gastro-esophageal reflux disease without esophagitis: Secondary | ICD-10-CM | POA: Diagnosis not present

## 2023-03-02 NOTE — Progress Notes (Unsigned)
PCP: Wanda Goodell, MD   No chief complaint on file.   HPI:      [name] is a 52 y.o. [phone] whose LMP was No LMP recorded., presents today for her annual examination.  Her menses are regular every 28-30 days, lasting 3 days on OCPs.  Dysmenorrhea mild, occurring first 1-2 days of flow. She does not have intermenstrual bleeding.  Sex activity: single partner, contraception - OCP (estrogen/progesterone). No pain/bleeding.  Last Pap: 09/13/20  Results were: no abnormalities/neg HPV DNA Hx of STDs: ext HPV; lesions treated with podophyllin without relief 2021 and aldara 1/22 (no TCA available) without relief. Still has lesions, interested in tx.   Last mammogram: 10/21/22 Results were: normal--routine follow-up in 12 months There is no FH of breast cancer. There is no FH of ovarian cancer. The patient does not do self-breast exams.  Colonoscopy: 2022 at [facility];  Repeat due after 7 years. FH colon cancer in her MGM and pancreatic cancer in her mat cousin (deceased). I recommended genetic testing for pt's mat side since she doesn't qualify.   Tobacco use: The patient denies current or previous tobacco use. Alcohol use: few drinks wkly No drug use. Exercise: min active  She does not get adequate calcium and Vitamin D in her diet.  Labs with PCP. Pt has been unable to lose wt in past even with increased exercise/calorie and carb restrictions. Is not exercising as much this yr, has gained wt.  Had normal TSH last yr. Up 6 # since 11/22 appt. No FH obesity.   Past Medical History:  Diagnosis Date   Adenomatous colon polyp 2014   removed on colonoscopy   Gastritis    History of abnormal cervical Pap smear 2015   Knee pain    Uterine fibroid     Past Surgical History:  Procedure Laterality Date   CESAREAN SECTION  1998/2003   COLONOSCOPY  11/02/2012   adenomatous polyp and internal hemmorhoid   ESOPHAGOGASTRODUODENOSCOPY  2014   gastritis   TONSILLECTOMY       Family History  Problem Relation Age of Onset   AAA (abdominal aortic aneurysm) Father    Diabetes Father    Throat cancer Maternal Uncle    AAA (abdominal aortic aneurysm) Paternal Aunt    Colon cancer Maternal Grandmother 80   Congestive Heart Failure Maternal Grandmother    Heart attack Maternal Grandfather    Pancreatic cancer Cousin 33    Social History   Socioeconomic History   Marital status: Divorced    Spouse name: Not on file   Number of children: 3   Years of education: Not on file   Highest education level: Not on file  Occupational History   Occupation: self employed  Tobacco Use   Smoking status: Never   Smokeless tobacco: Never  Vaping Use   Vaping status: Never Used  Substance and Sexual Activity   Alcohol use: No   Drug use: No   Sexual activity: Yes    Partners: Male    Birth control/protection: Pill  Other Topics Concern   Not on file  Social History Narrative   Not on file   Social Determinants of Health   Financial Resource Strain: Not on file  Food Insecurity: Not on file  Transportation Needs: Not on file  Physical Activity: Not on file  Stress: Not on file  Social Connections: Not on file  Intimate Partner Violence: Not on file     Current Outpatient  Medications:    clonazePAM (KLONOPIN) 0.5 MG tablet, Take by mouth., Disp: , Rfl:    levonorgestrel-ethinyl estradiol (ALTAVERA) 0.15-30 MG-MCG tablet, TAKE 1 TABLET BY MOUTH EVERY DAY, Disp: 28 tablet, Rfl: 1     ROS:  Review of Systems  Constitutional:  Negative for fatigue, fever and unexpected weight change.  Respiratory:  Negative for cough, shortness of breath and wheezing.   Cardiovascular:  Negative for chest pain, palpitations and leg swelling.  Gastrointestinal:  Negative for blood in stool, constipation, diarrhea, nausea and vomiting.  Endocrine: Negative for cold intolerance, heat intolerance and polyuria.  Genitourinary:  Positive for genital sores. Negative for  dyspareunia, dysuria, flank pain, frequency, hematuria, menstrual problem, pelvic pain, urgency, vaginal bleeding, vaginal discharge and vaginal pain.  Musculoskeletal:  Negative for back pain, joint swelling and myalgias.  Skin:  Negative for rash.  Neurological:  Negative for dizziness, syncope, light-headedness, numbness and headaches.  Hematological:  Negative for adenopathy.  Psychiatric/Behavioral:  Negative for agitation, confusion, sleep disturbance and suicidal ideas. The patient is not nervous/anxious.    BREAST: No symptoms    Objective: There were no vitals taken for this visit.   Physical Exam Constitutional:      Appearance: She is well-developed.  Genitourinary:     Vulva normal.     Right Labia: lesions.     Right Labia: No rash or tenderness.    Left Labia: lesions.     Left Labia: No tenderness or rash.    No vaginal discharge, erythema or tenderness.      Right Adnexa: not tender and no mass present.    Left Adnexa: not tender and no mass present.    No cervical friability or polyp.     Uterus is not enlarged or tender.  Breasts:    Right: No mass, nipple discharge, skin change or tenderness.     Left: No mass, nipple discharge, skin change or tenderness.  Neck:     Thyroid: No thyromegaly.  Cardiovascular:     Rate and Rhythm: Normal rate and regular rhythm.     Heart sounds: Normal heart sounds. No murmur heard. Pulmonary:     Effort: Pulmonary effort is normal.     Breath sounds: Normal breath sounds.  Abdominal:     Palpations: Abdomen is soft.     Tenderness: There is no abdominal tenderness. There is no guarding or rebound.  Musculoskeletal:        General: Normal range of motion.     Cervical back: Normal range of motion.  Lymphadenopathy:     Cervical: No cervical adenopathy.  Neurological:     General: No focal deficit present.     Mental Status: She is alert and oriented to person, place, and time.     Cranial Nerves: No cranial nerve  deficit.  Skin:    General: Skin is warm and dry.  Psychiatric:        Mood and Affect: Mood normal.        Behavior: Behavior normal.        Thought Content: Thought content normal.        Judgment: Judgment normal.  Vitals reviewed.    Multiple flat warts treated with TCA. Pt tolerated well.   Assessment/Plan:  Encounter for annual routine gynecological examination  Encounter for surveillance of contraceptive pills - Plan: levonorgestrel-ethinyl estradiol (ALTAVERA) 0.15-30 MG-MCG tablet; OCP RF  Encounter for screening mammogram for malignant neoplasm of breast - Plan: MM 3D SCREEN BREAST BILATERAL;  pt to schedule mammo  Genital warts--treated with TCA today. Wash in 4-6 hrs with warm soapy water. Wound care discussed. F/u in 1+ months prn sx.   Weight gain--discussed sx with (peri)menopause. Pt to do labs with PCP (since TSH/labs normal last yr and no other sx). Suggested she discuss new wt loss meds with PCP.    No orders of the defined types were placed in this encounter.           GYN counsel mammography screening, adequate intake of calcium and vitamin D, diet and exercise    F/U  No follow-ups on file.  Brionna Romanek B. Arieliz Latino, PA-C 03/02/2023 5:14 PM

## 2023-03-03 ENCOUNTER — Ambulatory Visit (INDEPENDENT_AMBULATORY_CARE_PROVIDER_SITE_OTHER): Payer: 59 | Admitting: Obstetrics and Gynecology

## 2023-03-03 ENCOUNTER — Encounter: Payer: Self-pay | Admitting: Obstetrics and Gynecology

## 2023-03-03 VITALS — BP 99/65 | HR 73 | Ht 61.0 in | Wt 177.0 lb

## 2023-03-03 DIAGNOSIS — Z3041 Encounter for surveillance of contraceptive pills: Secondary | ICD-10-CM

## 2023-03-03 DIAGNOSIS — Z01419 Encounter for gynecological examination (general) (routine) without abnormal findings: Secondary | ICD-10-CM

## 2023-03-03 DIAGNOSIS — N921 Excessive and frequent menstruation with irregular cycle: Secondary | ICD-10-CM

## 2023-03-03 DIAGNOSIS — Z1231 Encounter for screening mammogram for malignant neoplasm of breast: Secondary | ICD-10-CM

## 2023-03-03 DIAGNOSIS — A630 Anogenital (venereal) warts: Secondary | ICD-10-CM

## 2023-03-03 MED ORDER — LEVONORGESTREL-ETHINYL ESTRAD 0.15-30 MG-MCG PO TABS: 1.0000 | ORAL_TABLET | Freq: Every day | ORAL | 3 refills | Status: DC

## 2023-03-03 NOTE — Patient Instructions (Signed)
I value your feedback and you entrusting us with your care. If you get a [facility] patient survey, I would appreciate you taking the time to let us know about your experience today. Thank you! ? ? ?

## 2023-03-09 ENCOUNTER — Telehealth: Payer: Self-pay

## 2023-03-09 NOTE — Telephone Encounter (Signed)
Pt was in last week seen Wanda Lloyd she has had vaginal bleeding and its still moderate to heavy, wanted to see what you wanted to do.

## 2023-03-09 NOTE — Telephone Encounter (Signed)
Pt needs to continue pills. May take the whole pill pack before she starts to regulate.

## 2023-03-09 NOTE — Telephone Encounter (Signed)
Pt aware.

## 2023-03-20 ENCOUNTER — Ambulatory Visit: Payer: 59 | Admitting: Obstetrics and Gynecology

## 2023-03-23 NOTE — Progress Notes (Signed)
 Jeffie Cheryl BRAVO, MD   Chief Complaint  Patient presents with   Follow-up    Pt states her bleeding is very light.     HPI:      [name] is a 52 y.o. [phone] whose LMP was Patient's last menstrual period was 02/21/2023 (exact date)., presents today for pelvic exam to complete annual from 03/03/23 due to heavy bleeding that day. Pt having heavy bleeding after stopping OCPs and trying HRT. Restarted OCPs end of 11/24. On 2nd pill pack now, 1st wk. Bleeding never stopped but is spotting only now, much improved. No dysmen.  Also coming for ext genital wart tx since heavy bleeding stopped with OCP restart. Hx of ext warts, treated with TCA 12/23 with mostly relief. Still has a few lesions.   Patient Active Problem List   Diagnosis Date Noted   Bacterial vaginosis 04/11/2020   Genital warts 04/11/2020   Uterine fibroid 10/13/2016   Gastritis 10/13/2016   Adenomatous colon polyp 10/13/2016    Past Surgical History:  Procedure Laterality Date   CESAREAN SECTION  1998/2003   COLONOSCOPY  11/02/2012   adenomatous polyp and internal hemmorhoid   ESOPHAGOGASTRODUODENOSCOPY  2014   gastritis   TONSILLECTOMY      Family History  Problem Relation Age of Onset   AAA (abdominal aortic aneurysm) Father    Diabetes Father    Throat cancer Maternal Uncle    AAA (abdominal aortic aneurysm) Paternal Aunt    Colon cancer Maternal Grandmother 51   Congestive Heart Failure Maternal Grandmother    Heart attack Maternal Grandfather    Pancreatic cancer Cousin 77    Social History   Socioeconomic History   Marital status: Divorced    Spouse name: Not on file   Number of children: 3   Years of education: Not on file   Highest education level: Not on file  Occupational History   Occupation: self employed  Tobacco Use   Smoking status: Never   Smokeless tobacco: Never  Vaping Use   Vaping status: Never Used  Substance and Sexual Activity   Alcohol use: No   Drug use: No    Sexual activity: Yes    Partners: Male    Birth control/protection: Pill  Other Topics Concern   Not on file  Social History Narrative   Not on file   Social Drivers of Health   Financial Resource Strain: Not on file  Food Insecurity: Not on file  Transportation Needs: Not on file  Physical Activity: Not on file  Stress: Not on file  Social Connections: Not on file  Intimate Partner Violence: Not on file    Outpatient Medications Prior to Visit  Medication Sig Dispense Refill   clonazePAM (KLONOPIN) 0.5 MG tablet Take by mouth.     fluticasone (CUTIVATE) 0.05 % cream Apply topically 2 (two) times daily as needed.     levonorgestrel -ethinyl estradiol (ALTAVERA) 0.15-30 MG-MCG tablet Take 1 tablet by mouth daily. 84 tablet 3   triamcinolone  cream (KENALOG) 0.1 % Apply topically.     No facility-administered medications prior to visit.      ROS:  Review of Systems  Constitutional:  Negative for fever.  Gastrointestinal:  Negative for blood in stool, constipation, diarrhea, nausea and vomiting.  Genitourinary:  Positive for vaginal bleeding. Negative for dyspareunia, dysuria, flank pain, frequency, hematuria, urgency, vaginal discharge and vaginal pain.  Musculoskeletal:  Negative for back pain.  Skin:  Negative for rash.  BREAST: No symptoms   OBJECTIVE:   Vitals:  BP 120/80   Ht 5' 1 (1.549 m)   Wt 173 lb (78.5 kg)   LMP 02/21/2023 (Exact Date)   BMI 32.69 kg/m   Physical Exam Vitals reviewed.  Constitutional:      Appearance: She is well-developed.  Pulmonary:     Effort: Pulmonary effort is normal.  Genitourinary:    General: Normal vulva.     Pubic Area: No rash.      Labia:        Right: No rash, tenderness or lesion.        Left: No rash, tenderness or lesion.      Vagina: Normal. No vaginal discharge, erythema or tenderness.     Cervix: Normal.     Uterus: Normal. Not enlarged and not tender.      Adnexa: Right adnexa normal and left adnexa  normal.       Right: No mass or tenderness.         Left: No mass or tenderness.      Musculoskeletal:        General: Normal range of motion.     Cervical back: Normal range of motion.  Skin:    General: Skin is warm and dry.  Neurological:     General: No focal deficit present.     Mental Status: She is alert and oriented to person, place, and time.  Psychiatric:        Mood and Affect: Mood normal.        Behavior: Behavior normal.        Thought Content: Thought content normal.        Judgment: Judgment normal.    WARTS TREATED WITH TCA; WASH IN 4-6 HRS  Assessment/Plan: Genital warts--treated with TCA today. RTO in 4 wks if sx persist.   Menorrhagia with irregular cycle--sx improving with OCPs. F/u after 3 months if sx persist for further eval.     Return if symptoms worsen or fail to improve.  Vladimir Lenhoff B. Quana Chamberlain, PA-C 03/26/2023 10:38 AM

## 2023-03-26 ENCOUNTER — Ambulatory Visit: Payer: 59 | Admitting: Obstetrics and Gynecology

## 2023-03-26 ENCOUNTER — Encounter: Payer: Self-pay | Admitting: Obstetrics and Gynecology

## 2023-03-26 VITALS — BP 120/80 | Ht 61.0 in | Wt 173.0 lb

## 2023-03-26 DIAGNOSIS — A630 Anogenital (venereal) warts: Secondary | ICD-10-CM

## 2023-03-26 DIAGNOSIS — N921 Excessive and frequent menstruation with irregular cycle: Secondary | ICD-10-CM

## 2023-03-26 NOTE — Patient Instructions (Signed)
 I value your feedback and you entrusting [place] with your care. If you get a [facility] patient survey, I would appreciate you taking the time to let us know about your experience today. Thank you! ? ? ?

## 2023-04-20 ENCOUNTER — Telehealth: Payer: Self-pay

## 2023-04-20 DIAGNOSIS — D219 Benign neoplasm of connective and other soft tissue, unspecified: Secondary | ICD-10-CM

## 2023-04-20 DIAGNOSIS — N921 Excessive and frequent menstruation with irregular cycle: Secondary | ICD-10-CM

## 2023-04-20 NOTE — Telephone Encounter (Signed)
Patient calling in to report vaginal bleeding, states spotting to moderate amounts. She states continuing to take OCPs but was adamant on wanting you to know in case you might want to change her medication. Advised with the start of any new birth control irregular bleeding is normal for the first 3 months.

## 2023-04-20 NOTE — Telephone Encounter (Signed)
Pls let pt know I was advised and irregular bleeding normal for first 3 months of OCPs. She did well on OCPs in past and should do well again, once body readjusts to them. F/u on 4th pill pack if still having BTB.

## 2023-04-21 NOTE — Telephone Encounter (Signed)
Pt aware.

## 2023-05-16 IMAGING — US US PELVIS COMPLETE WITH TRANSVAGINAL
1 series · 13 of 25 positions shown · non-contrast
Comparison: None

CLINICAL DATA: 50-year-old with abnormal uterine bleeding.

EXAM:
TRANSABDOMINAL AND TRANSVAGINAL ULTRASOUND OF PELVIS
TECHNIQUE: Both transabdominal and transvaginal ultrasound examinations of the
pelvis were performed. Transabdominal technique was performed for
global imaging of the pelvis including uterus, ovaries, adnexal
regions, and pelvic cul-de-sac. It was necessary to proceed with
endovaginal exam following the transabdominal exam to visualize the
ovaries and endometrium.

[Series 1: us pelvic complete with transvaginal · 13 of 63 slices shown]
[im 1/63]
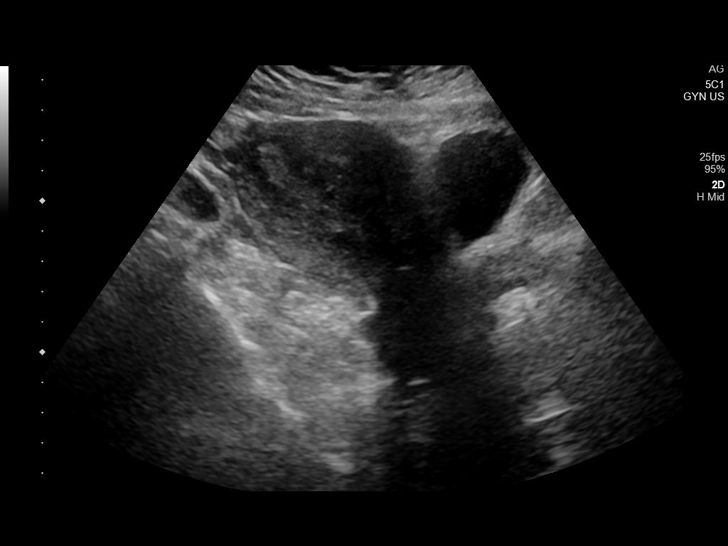
[im 6/63]
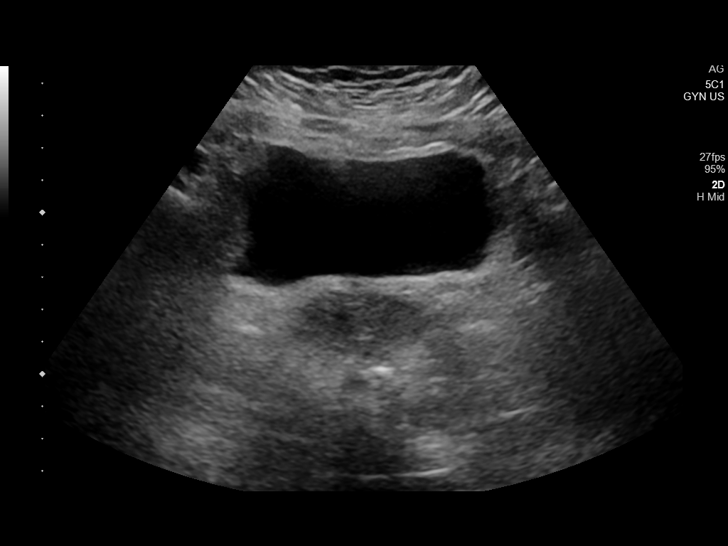
[im 11/63]
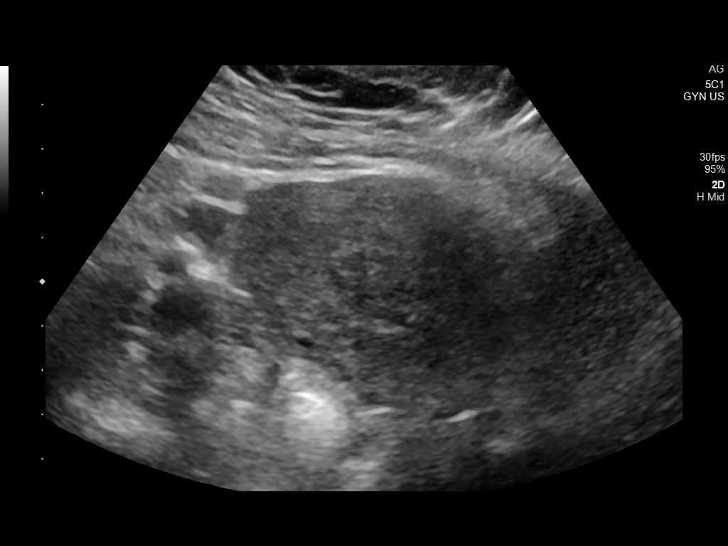
[im 16/63]
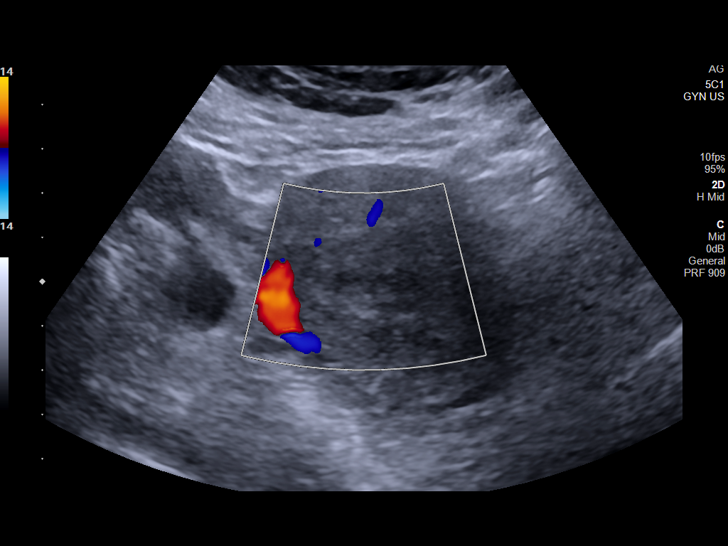
[im 21/63]
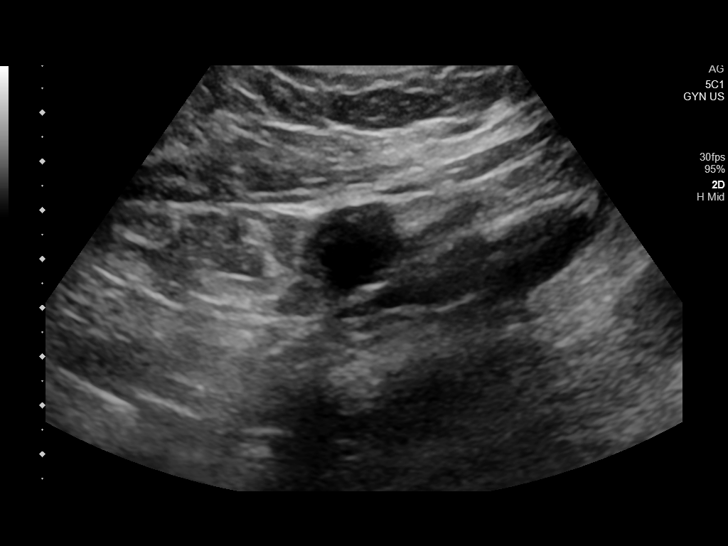
[im 26/63]
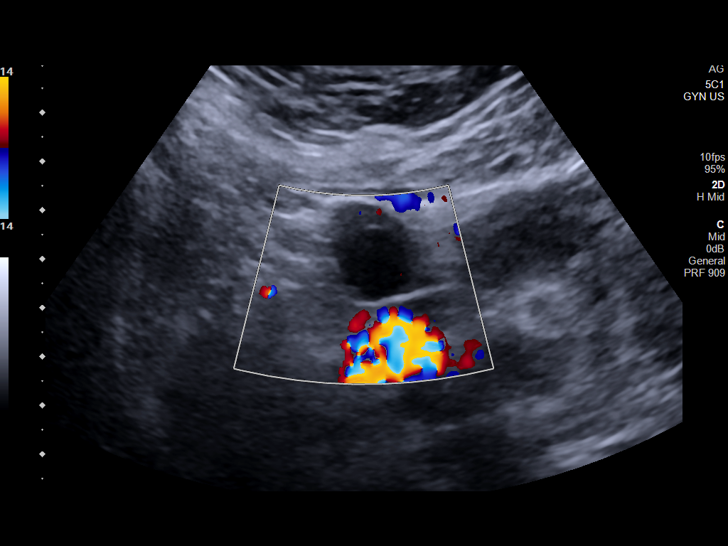
[im 32/63]
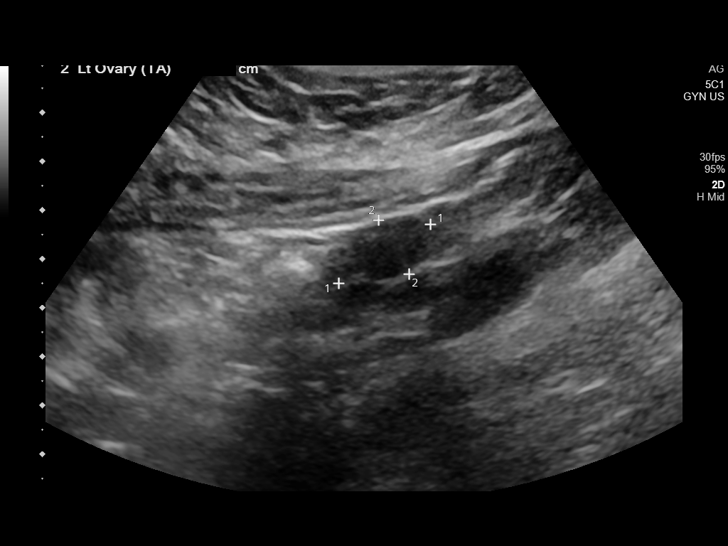
[im 37/63]
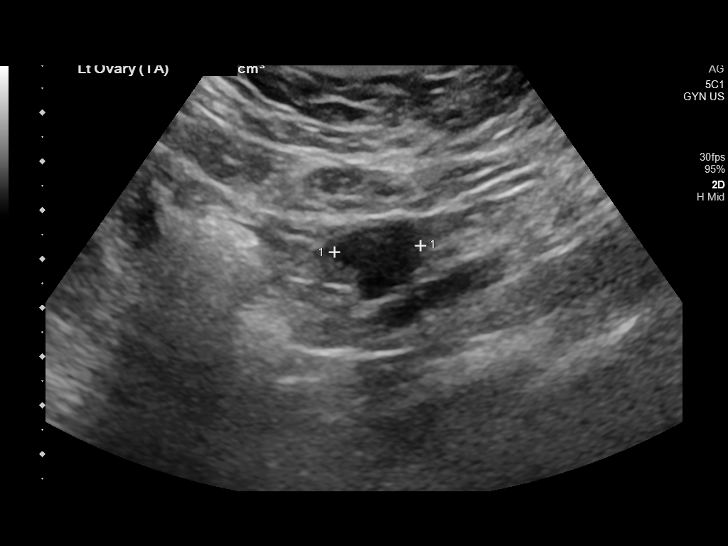
[im 42/63]
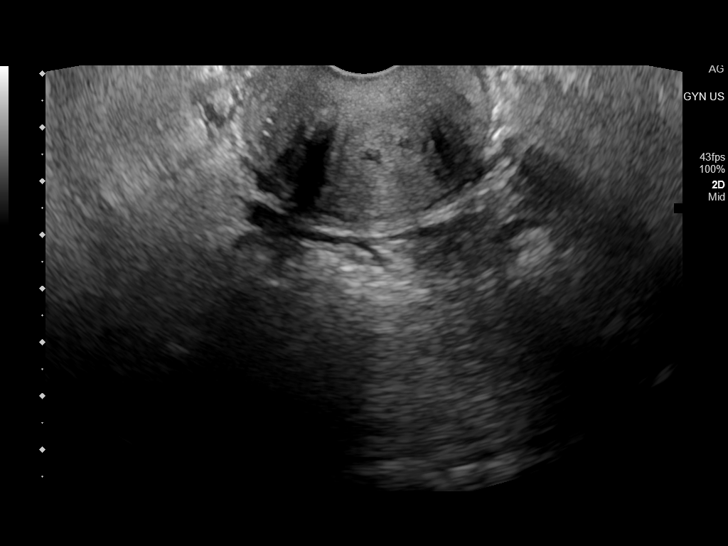
[im 47/63]
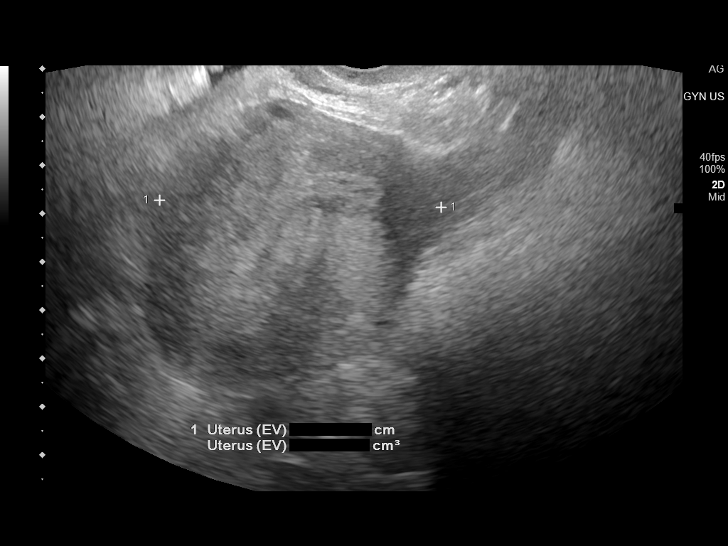
[im 52/63]
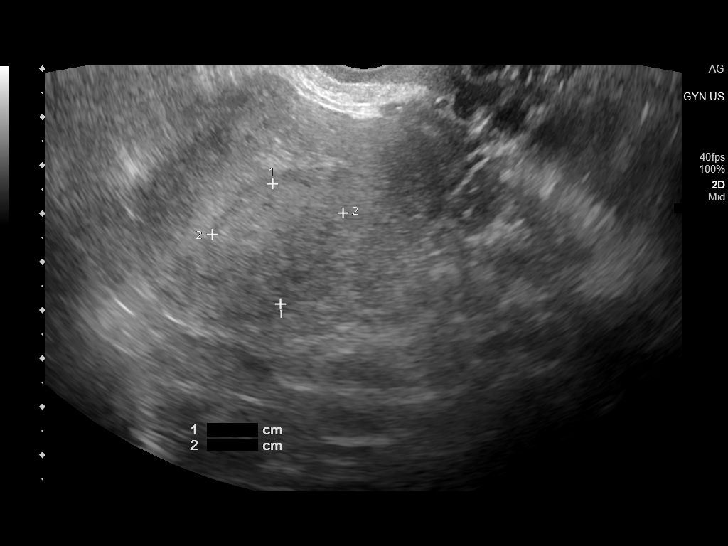
[im 57/63]
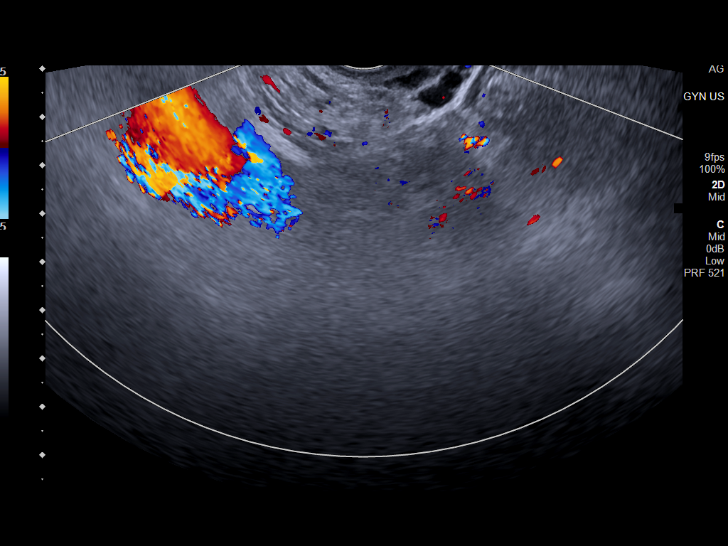
[im 63/63]
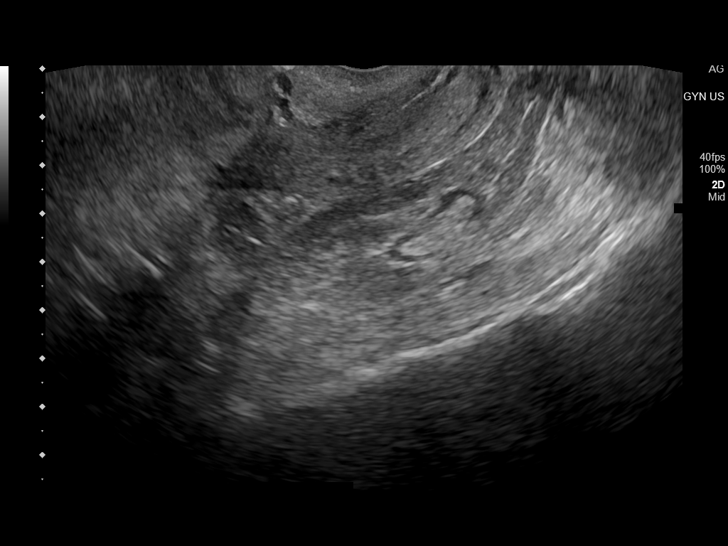

[13 of 25 positions shown; findings below may reference images not displayed]

FINDINGS: Uterus

Measurements: 11.1 x 5.8 x 6.3 cm = volume: 2 low mL. Uterus is
anteverted. Heterogeneous echotexture. There is a heterogeneous
hypoechoic posterior body fibroid measuring 2.8 x 2.5 x 2.3 cm,
which may be submucosal and about the endometrium.

Endometrium

Thickness: 6 mm.  No focal abnormality visualized.

Right ovary

Measurements: 3.0 x 2.4 x 2.0 cm = volume: 8 mL. The ovary is only
visualized transabdominally. There is a physiologic follicle. Blood
flow is demonstrated. No adnexal mass.

Left ovary

Measurements: 2.2 x 1.3 x 1.8 cm = volume: 3 mL. The ovary is only
visualized transabdominally. Normal appearance. Blood flow is
demonstrated. No adnexal mass.

Other findings

No abnormal free fluid.
IMPRESSION: 1. Uterine fibroid that is likely submucosal measures 2.8 x 2.5 x
2.3 cm.
2. Endometrial thickness of 6 mm, this is normal of patient is
premenopausal. In a premenopausal patient, if bleeding remains
unresponsive to hormonal or medical therapy, sonohysterogram should
be considered for focal lesion work-up. If patient is
postmenopausal, endometrial sampling is indicated to exclude
carcinoma. If results are benign, sonohysterogram should be
considered for focal lesion work-up. (Ref: Radiological Reasoning:
Algorithmic Workup of Abnormal Vaginal Bleeding with Endovaginal
Sonography and Sonohysterography. AJR [id number]; [id number]:[id number])

## 2023-05-19 DIAGNOSIS — L700 Acne vulgaris: Secondary | ICD-10-CM | POA: Diagnosis not present

## 2023-05-19 DIAGNOSIS — L408 Other psoriasis: Secondary | ICD-10-CM | POA: Diagnosis not present

## 2023-05-19 DIAGNOSIS — L738 Other specified follicular disorders: Secondary | ICD-10-CM | POA: Diagnosis not present

## 2023-05-29 NOTE — Telephone Encounter (Signed)
 Patient reports she is still having the same BTB and she is currently on a full period which started on Monday and is still fairly heavy. Her periods usually do not last this long. She is inquiring if she should go ahead and start her next (4th pack) of pills or wait the full seven days. She advised she is just tired of all this and also inquiring if pills can be changed at this time. Advised Helmut Muster is out of the office until Monday. Continue current regimen until she hears back from [place].

## 2023-06-01 NOTE — Telephone Encounter (Signed)
 Pt aware. States bleeding has slowed down.

## 2023-06-01 NOTE — Telephone Encounter (Signed)
 Let's check u/s due to continued BTB and pt to start new pill pack in the meantime at the normal time. Hx of small fibroid on 2022 u/s, so will re-eval it's size.

## 2023-06-01 NOTE — Addendum Note (Signed)
 Addended by: Althea Grimmer B on: 06/01/2023 08:43 AM   Modules accepted: Orders

## 2023-06-08 ENCOUNTER — Ambulatory Visit (INDEPENDENT_AMBULATORY_CARE_PROVIDER_SITE_OTHER)

## 2023-06-08 DIAGNOSIS — N921 Excessive and frequent menstruation with irregular cycle: Secondary | ICD-10-CM | POA: Diagnosis not present

## 2023-06-08 DIAGNOSIS — D219 Benign neoplasm of connective and other soft tissue, unspecified: Secondary | ICD-10-CM | POA: Diagnosis not present

## 2023-06-09 ENCOUNTER — Telehealth: Payer: Self-pay | Admitting: Obstetrics and Gynecology

## 2023-06-09 NOTE — Telephone Encounter (Signed)
 Pt aware of GYN u/s results. Aware of RTO cyst, occas pains RLQ with exercise. Also discussed possible adenomyosis. Pt on 4th pill pack now, bleeding much improved. Will cont to follow bleeding. If BTB persists, will try lower dose OCPs. Normal TSH 4/24.

## 2023-08-19 DIAGNOSIS — Z0000 Encounter for general adult medical examination without abnormal findings: Secondary | ICD-10-CM | POA: Diagnosis not present

## 2023-08-19 DIAGNOSIS — F419 Anxiety disorder, unspecified: Secondary | ICD-10-CM | POA: Diagnosis not present

## 2023-08-19 DIAGNOSIS — Z1331 Encounter for screening for depression: Secondary | ICD-10-CM | POA: Diagnosis not present

## 2023-08-20 DIAGNOSIS — Z0000 Encounter for general adult medical examination without abnormal findings: Secondary | ICD-10-CM | POA: Diagnosis not present

## 2023-09-22 DIAGNOSIS — L728 Other follicular cysts of the skin and subcutaneous tissue: Secondary | ICD-10-CM | POA: Diagnosis not present

## 2023-10-06 DIAGNOSIS — J0100 Acute maxillary sinusitis, unspecified: Secondary | ICD-10-CM | POA: Diagnosis not present

## 2023-10-06 DIAGNOSIS — Z1331 Encounter for screening for depression: Secondary | ICD-10-CM | POA: Diagnosis not present

## 2023-10-22 ENCOUNTER — Ambulatory Visit
Admission: RE | Admit: 2023-10-22 | Discharge: 2023-10-22 | Disposition: A | Discharge status: Home / Self Care | Source: Ambulatory Visit | Attending: Obstetrics and Gynecology | Admitting: Obstetrics and Gynecology

## 2023-10-22 DIAGNOSIS — Z1231 Encounter for screening mammogram for malignant neoplasm of breast: Secondary | ICD-10-CM | POA: Diagnosis not present

## 2023-10-26 ENCOUNTER — Ambulatory Visit: Payer: Self-pay | Admitting: Obstetrics and Gynecology

## 2023-11-28 ENCOUNTER — Other Ambulatory Visit: Payer: Self-pay | Admitting: Obstetrics and Gynecology

## 2023-11-28 DIAGNOSIS — N921 Excessive and frequent menstruation with irregular cycle: Secondary | ICD-10-CM

## 2023-11-28 DIAGNOSIS — Z3041 Encounter for surveillance of contraceptive pills: Secondary | ICD-10-CM

## 2023-12-04 ENCOUNTER — Other Ambulatory Visit: Payer: Self-pay

## 2023-12-04 DIAGNOSIS — Z3041 Encounter for surveillance of contraceptive pills: Secondary | ICD-10-CM

## 2023-12-04 DIAGNOSIS — N921 Excessive and frequent menstruation with irregular cycle: Secondary | ICD-10-CM

## 2023-12-04 MED ORDER — LEVONORGESTREL-ETHINYL ESTRAD 0.15-30 MG-MCG PO TABS: 1.0000 | ORAL_TABLET | Freq: Every day | ORAL | 0 refills | Status: DC

## 2023-12-04 NOTE — Progress Notes (Signed)
 Patient will be due for an annual in 3 months.  She is aware.

## 2024-02-10 ENCOUNTER — Telehealth: Payer: Self-pay | Admitting: Obstetrics and Gynecology

## 2024-02-10 DIAGNOSIS — N921 Excessive and frequent menstruation with irregular cycle: Secondary | ICD-10-CM

## 2024-02-10 DIAGNOSIS — Z3041 Encounter for surveillance of contraceptive pills: Secondary | ICD-10-CM

## 2024-02-10 MED ORDER — LEVONORGESTREL-ETHINYL ESTRAD 0.15-30 MG-MCG PO TABS: 1.0000 | ORAL_TABLET | Freq: Every day | ORAL | 0 refills | Status: DC

## 2024-02-10 NOTE — Telephone Encounter (Signed)
 Pls let pt know annual due 12/25 but I won't be back till early January. I'll have office call her to schedule the annual once my date is available. I sent in more RF on OCPs till then. Thx.

## 2024-02-10 NOTE — Telephone Encounter (Signed)
 Patient called and asked to a return call from you in reference to a refill of birth control, there aren't any available appointment for you(Wanda Lloyd or any of the other providers)  Patient is completely out, and the sooner the better,  She stated that the nurse said she had a refill in September.  Please call patient once this is complete.

## 2024-02-10 NOTE — Telephone Encounter (Signed)
 Called patient and advised to call pharmacy to request refill. Since annual Rx 84 tablets with 3 refills plus an extra 84 tablets sent on 11/2023. She does not recall being at pharmacy 5 times. Advised her RF she's requesting could be under an old Rx. She will call pharmacy and call back if needed.

## 2024-02-11 NOTE — Telephone Encounter (Signed)
 Patient has question on US  done in March, why do you think her left ovary wasn't seen? Says she has been thinking this for a while and just hasn't had a chance to call and ask you.

## 2024-02-11 NOTE — Telephone Encounter (Signed)
 They are small and sometimes bowel affects ovary being seen. If anything was abnormal, like a cyst, then that would have been seen. Caroleena Paolini

## 2024-02-11 NOTE — Telephone Encounter (Signed)
 Patient aware

## 2024-03-28 NOTE — Progress Notes (Unsigned)
 "  PCP: Jeffie Cheryl BRAVO, MD   No chief complaint on file.   HPI:      [name] is a 54 y.o. [phone] whose LMP was No LMP recorded., presents today for her annual examination.  Her menses were regular every 28-30 days, lasting 3 days on OCPs.  Dysmenorrhea mild, occurring first 1-2 days of flow, no BTB. Stopped OCPs 9/24 for wt loss and started prog and testosterone with [facility] after labs. Had light period 12/23/22, then period 02/21/23, heavy, changing products Q1-2 hrs with clots in AM, Q3-4 hrs in afternoons, mod dysmen. Still bleeding today. Was on OCPs for menorrhagia in past. .   Sex activity: single partner, contraception - vasectomy. No pain/bleeding/dryness.  Last Pap: 09/13/20  Results were: no abnormalities/neg HPV DNA Hx of STDs: ext HPV; lesions treated with podophyllin without relief 2021 and aldara  1/22 (no TCA available) without relief. Treated with TCA (once back in stock) 12/23. Most lesions resolved but still has a few she would like treated. Treated 1/25*****  Last mammogram: 10/22/23 Results were: normal--routine follow-up in 12 months There is no FH of breast cancer. There is no FH of ovarian cancer. The patient does not do self-breast exams.  Colonoscopy: 2022 at [facility];  Repeat due after 7 years. FH colon cancer in her MGM and pancreatic cancer in her mat cousin (deceased). I recommended genetic testing for pt's mat side since she doesn't qualify.   Tobacco use: The patient denies current or previous tobacco use. Alcohol use: none No drug use. Exercise: min active  She does get adequate calcium but not Vitamin D in her diet.  Labs with PCP.  Past Medical History:  Diagnosis Date   Adenomatous colon polyp 2014   removed on colonoscopy   Gastritis    History of abnormal cervical Pap smear 2015   Knee pain    Uterine fibroid     Past Surgical History:  Procedure Laterality Date   CESAREAN SECTION  1998/2003   COLONOSCOPY  11/02/2012   adenomatous  polyp and internal hemmorhoid   ESOPHAGOGASTRODUODENOSCOPY  2014   gastritis   TONSILLECTOMY      Family History  Problem Relation Age of Onset   AAA (abdominal aortic aneurysm) Father    Diabetes Father    Throat cancer Maternal Uncle    AAA (abdominal aortic aneurysm) Paternal Aunt    Colon cancer Maternal Grandmother 80   Congestive Heart Failure Maternal Grandmother    Heart attack Maternal Grandfather    Pancreatic cancer Cousin 45   Breast cancer Neg Hx     Social History   Socioeconomic History   Marital status: Divorced    Spouse name: Not on file   Number of children: 3   Years of education: Not on file   Highest education level: Not on file  Occupational History   Occupation: self employed  Tobacco Use   Smoking status: Never   Smokeless tobacco: Never  Vaping Use   Vaping status: Never Used  Substance and Sexual Activity   Alcohol use: No   Drug use: No   Sexual activity: Yes    Partners: Male    Birth control/protection: Pill  Other Topics Concern   Not on file  Social History Narrative   Not on file   Social Drivers of Health   Tobacco Use: Low Risk (10/22/2023)   Patient History    Smoking Tobacco Use: Never    Smokeless Tobacco Use: Never  Passive Exposure: Not on file  Recent Concern: Tobacco Use - Medium Risk (10/06/2023)   Received from [facility]   Patient History    Smoking Tobacco Use: Former    Smokeless Tobacco Use: Never    Passive Exposure: Never  Physicist, Medical Strain: Low Risk  (10/06/2023)   Received from [facility]   Overall Financial Resource Strain (CARDIA)    Difficulty of Paying Living Expenses: Not hard at all  Food Insecurity: No Food Insecurity (10/06/2023)   Received from [facility]   Epic    Within the past 12 months, you worried that your food would run out before you got the money to buy more.: Never true    Within the past 12 months, the food you bought  just didn't last and you didn't have money to get more.: Never true  Transportation Needs: No Transportation Needs (10/06/2023)   Received from [facility] - Transportation    In the past 12 months, has lack of transportation kept you from medical appointments or from getting medications?: No    Lack of Transportation (Non-Medical): No  Physical Activity: Not on file  Stress: Not on file  Social Connections: Not on file  Intimate Partner Violence: Not on file  Depression (EYV7-0): Not on file  Alcohol Screen: Not on file  Housing: Low Risk  (10/06/2023)   Received from [facility]   Epic    In the last 12 months, was there a time when you were not able to pay the mortgage or rent on time?: No    In the past 12 months, how many times have you moved where you were living?: 0    At any time in the past 12 months, were you homeless or living in a shelter (including now)?: No  Utilities: Not At Risk (10/06/2023)   Received from [facility]   Epic    In the past 12 months has the electric, gas, oil, or water company threatened to shut off services in your home?: No  Health Literacy: Not on file     Current Outpatient Medications:    clonazePAM (KLONOPIN) 0.5 MG tablet, Take by mouth., Disp: , Rfl:    fluticasone (CUTIVATE) 0.05 % cream, Apply topically 2 (two) times daily as needed., Disp: , Rfl:    levonorgestrel -ethinyl estradiol (ALTAVERA) 0.15-30 MG-MCG tablet, Take 1 tablet by mouth daily., Disp: 84 tablet, Rfl: 0   triamcinolone  cream (KENALOG) 0.1 %, Apply topically., Disp: , Rfl:      ROS:  Review of Systems  Constitutional:  Negative for fatigue, fever and unexpected weight change.  Respiratory:  Negative for cough, shortness of breath and wheezing.   Cardiovascular:  Negative for chest pain, palpitations and leg swelling.  Gastrointestinal:  Negative for blood in stool, constipation, diarrhea, nausea and  vomiting.  Endocrine: Negative for cold intolerance, heat intolerance and polyuria.  Genitourinary:  Negative for dyspareunia, dysuria, flank pain, frequency, genital sores, hematuria, menstrual problem, pelvic pain, urgency, vaginal bleeding, vaginal discharge and vaginal pain.  Musculoskeletal:  Negative for back pain, joint swelling and myalgias.  Skin:  Negative for rash.  Neurological:  Negative for dizziness, syncope, light-headedness, numbness and headaches.  Hematological:  Negative for adenopathy.  Psychiatric/Behavioral:  Negative for agitation, confusion, sleep disturbance and suicidal ideas. The patient is not nervous/anxious.    BREAST: No symptoms    Objective: There were no vitals taken  for this visit.   Physical Exam Constitutional:      Appearance: She is well-developed.  Genitourinary:  Breasts:    Right: No mass, nipple discharge, skin change or tenderness.     Left: No mass, nipple discharge, skin change or tenderness.  Neck:     Thyroid: No thyromegaly.  Cardiovascular:     Rate and Rhythm: Normal rate and regular rhythm.     Heart sounds: Normal heart sounds. No murmur heard. Pulmonary:     Effort: Pulmonary effort is normal.     Breath sounds: Normal breath sounds.  Abdominal:     Palpations: Abdomen is soft.     Tenderness: There is no abdominal tenderness. There is no guarding.  Musculoskeletal:        General: Normal range of motion.     Cervical back: Normal range of motion.  Neurological:     Mental Status: She is alert and oriented to person, place, and time.     Cranial Nerves: No cranial nerve deficit.  Psychiatric:        Behavior: Behavior normal.  Vitals reviewed.   PT DECLINES GYN EXAM TODAY DUE TO HEAVY BLEEDING  Assessment/Plan:  Encounter for annual routine gynecological examination  Encounter for surveillance of contraceptive pills - Plan: levonorgestrel -ethinyl estradiol (ALTAVERA) 0.15-30 MG-MCG tablet; restart OCPs given hx  of menometrorrhagia in past and heavy bleeding off them. F/u if AUB persists for further eval.   Menorrhagia with irregular cycle - Plan: levonorgestrel -ethinyl estradiol (ALTAVERA) 0.15-30 MG-MCG tablet; restart OCPs, d/c prometrium. Can continue testosterone prn. Discussed given her age/periods, HRT will not be sufficient for cycle control and not component for wt loss. Will f/u when RTO for genital wart tx.   Encounter for screening mammogram for malignant neoplasm of breast; pt current on mammo; will schedule 7/25  Genital warts--pt to RTO for tx when bleeding stops.    No orders of the defined types were placed in this encounter.           GYN counsel mammography screening, adequate intake of calcium and vitamin D, diet and exercise    F/U  No follow-ups on file.  Maham Quintin B. Guinevere Stephenson, PA-C 03/28/2024 4:25 PM "

## 2024-03-30 ENCOUNTER — Other Ambulatory Visit (HOSPITAL_COMMUNITY)
Admission: RE | Admit: 2024-03-30 | Discharge: 2024-03-30 | Disposition: A | Source: Ambulatory Visit | Attending: Obstetrics and Gynecology | Admitting: Obstetrics and Gynecology

## 2024-03-30 ENCOUNTER — Encounter: Payer: Self-pay | Admitting: Obstetrics and Gynecology

## 2024-03-30 ENCOUNTER — Ambulatory Visit: Admitting: Obstetrics and Gynecology

## 2024-03-30 VITALS — BP 101/67 | HR 84 | Ht 61.0 in | Wt 166.0 lb

## 2024-03-30 DIAGNOSIS — Z1151 Encounter for screening for human papillomavirus (HPV): Secondary | ICD-10-CM

## 2024-03-30 DIAGNOSIS — Z3041 Encounter for surveillance of contraceptive pills: Secondary | ICD-10-CM

## 2024-03-30 DIAGNOSIS — Z1231 Encounter for screening mammogram for malignant neoplasm of breast: Secondary | ICD-10-CM

## 2024-03-30 DIAGNOSIS — Z124 Encounter for screening for malignant neoplasm of cervix: Secondary | ICD-10-CM | POA: Insufficient documentation

## 2024-03-30 DIAGNOSIS — A630 Anogenital (venereal) warts: Secondary | ICD-10-CM | POA: Diagnosis not present

## 2024-03-30 DIAGNOSIS — N921 Excessive and frequent menstruation with irregular cycle: Secondary | ICD-10-CM | POA: Diagnosis not present

## 2024-03-30 DIAGNOSIS — Z01419 Encounter for gynecological examination (general) (routine) without abnormal findings: Secondary | ICD-10-CM

## 2024-03-30 DIAGNOSIS — Z01411 Encounter for gynecological examination (general) (routine) with abnormal findings: Secondary | ICD-10-CM | POA: Diagnosis not present

## 2024-03-30 DIAGNOSIS — D219 Benign neoplasm of connective and other soft tissue, unspecified: Secondary | ICD-10-CM

## 2024-03-30 MED ORDER — LEVONORGESTREL-ETHINYL ESTRAD 0.15-30 MG-MCG PO TABS: 1.0000 | ORAL_TABLET | Freq: Every day | ORAL | 3 refills | Status: AC

## 2024-03-30 NOTE — Patient Instructions (Signed)
 I value your feedback and you entrusting [place] with your care. If you get a [facility] patient survey, I would appreciate you taking the time to let us know about your experience today. Thank you!  [facility] ([place])--[phone]

## 2024-04-01 LAB — CYTOLOGY - PAP
Comment: NEGATIVE
Diagnosis: UNDETERMINED — AB
High risk HPV: NEGATIVE

## 2024-04-02 ENCOUNTER — Ambulatory Visit: Payer: Self-pay | Admitting: Obstetrics and Gynecology
